# Patient Record
Sex: Male | Born: 1950 | Race: White | Hispanic: No | Marital: Single | Smoking: Current every day smoker
Health system: Southern US, Community
[De-identification: ages and names within clinical notes are randomized; demographics above are authoritative.]

## PROBLEM LIST (undated history)

## (undated) DIAGNOSIS — I509 Heart failure, unspecified: Secondary | ICD-10-CM

## (undated) DIAGNOSIS — I2510 Atherosclerotic heart disease of native coronary artery without angina pectoris: Secondary | ICD-10-CM

## (undated) DIAGNOSIS — J9819 Other pulmonary collapse: Secondary | ICD-10-CM

## (undated) DIAGNOSIS — I10 Essential (primary) hypertension: Secondary | ICD-10-CM

## (undated) HISTORY — PX: TONSILLECTOMY: SUR1361

---

## 2003-12-16 ENCOUNTER — Inpatient Hospital Stay (HOSPITAL_COMMUNITY): Admission: AD | Admit: 2003-12-16 | Discharge: 2003-12-19 | Payer: Self-pay | Admitting: Cardiology

## 2004-01-27 ENCOUNTER — Inpatient Hospital Stay (HOSPITAL_COMMUNITY): Admission: AD | Admit: 2004-01-27 | Discharge: 2004-01-27 | Payer: Self-pay | Admitting: Cardiology

## 2004-01-27 ENCOUNTER — Ambulatory Visit: Payer: Self-pay | Admitting: Cardiology

## 2004-01-28 ENCOUNTER — Inpatient Hospital Stay (HOSPITAL_COMMUNITY): Admission: EM | Admit: 2004-01-28 | Discharge: 2004-01-29 | Payer: Self-pay | Admitting: *Deleted

## 2004-01-29 ENCOUNTER — Ambulatory Visit: Payer: Self-pay | Admitting: Cardiology

## 2011-03-19 ENCOUNTER — Other Ambulatory Visit: Payer: Self-pay

## 2011-03-19 ENCOUNTER — Inpatient Hospital Stay (HOSPITAL_COMMUNITY)
Admission: EM | Admit: 2011-03-19 | Discharge: 2011-03-22 | DRG: 287 | Disposition: A | Discharge status: Home / Self Care | Payer: Medicaid Other | Attending: Family Medicine | Admitting: Family Medicine

## 2011-03-19 ENCOUNTER — Encounter (HOSPITAL_COMMUNITY): Payer: Self-pay | Admitting: Emergency Medicine

## 2011-03-19 ENCOUNTER — Emergency Department (HOSPITAL_COMMUNITY): Payer: Medicaid Other

## 2011-03-19 DIAGNOSIS — E785 Hyperlipidemia, unspecified: Secondary | ICD-10-CM | POA: Diagnosis present

## 2011-03-19 DIAGNOSIS — I252 Old myocardial infarction: Secondary | ICD-10-CM

## 2011-03-19 DIAGNOSIS — R079 Chest pain, unspecified: Principal | ICD-10-CM | POA: Diagnosis present

## 2011-03-19 DIAGNOSIS — J9819 Other pulmonary collapse: Secondary | ICD-10-CM | POA: Insufficient documentation

## 2011-03-19 DIAGNOSIS — R0602 Shortness of breath: Secondary | ICD-10-CM | POA: Diagnosis present

## 2011-03-19 DIAGNOSIS — F17200 Nicotine dependence, unspecified, uncomplicated: Secondary | ICD-10-CM | POA: Diagnosis present

## 2011-03-19 DIAGNOSIS — Z7982 Long term (current) use of aspirin: Secondary | ICD-10-CM

## 2011-03-19 DIAGNOSIS — J449 Chronic obstructive pulmonary disease, unspecified: Secondary | ICD-10-CM | POA: Diagnosis present

## 2011-03-19 DIAGNOSIS — I2510 Atherosclerotic heart disease of native coronary artery without angina pectoris: Secondary | ICD-10-CM | POA: Diagnosis present

## 2011-03-19 DIAGNOSIS — Z720 Tobacco use: Secondary | ICD-10-CM | POA: Diagnosis present

## 2011-03-19 DIAGNOSIS — J4489 Other specified chronic obstructive pulmonary disease: Secondary | ICD-10-CM | POA: Diagnosis present

## 2011-03-19 DIAGNOSIS — I509 Heart failure, unspecified: Secondary | ICD-10-CM | POA: Diagnosis present

## 2011-03-19 DIAGNOSIS — I10 Essential (primary) hypertension: Secondary | ICD-10-CM | POA: Diagnosis present

## 2011-03-19 HISTORY — DX: Essential (primary) hypertension: I10

## 2011-03-19 HISTORY — DX: Heart failure, unspecified: I50.9

## 2011-03-19 HISTORY — DX: Atherosclerotic heart disease of native coronary artery without angina pectoris: I25.10

## 2011-03-19 HISTORY — DX: Other pulmonary collapse: J98.19

## 2011-03-19 LAB — BASIC METABOLIC PANEL
BUN: 14 mg/dL (ref 6–23)
CO2: 23 mEq/L (ref 19–32)
Calcium: 9.6 mg/dL (ref 8.4–10.5)
Chloride: 107 mEq/L (ref 96–112)
Creatinine, Ser: 0.86 mg/dL (ref 0.50–1.35)
GFR calc Af Amer: >90 mL/min (ref >90)
GFR calc non Af Amer: >90 mL/min (ref >90)
Glucose, Bld: 91 mg/dL (ref 70–99)
Potassium: 4.4 mEq/L (ref 3.5–5.1)
Sodium: 140 mEq/L (ref 135–145)

## 2011-03-19 LAB — URINALYSIS, ROUTINE W REFLEX MICROSCOPIC
Bilirubin Urine: NEGATIVE
Glucose, UA: NEGATIVE mg/dL
Hgb urine dipstick: NEGATIVE
Ketones, ur: NEGATIVE mg/dL
Leukocytes, UA: NEGATIVE
Nitrite: NEGATIVE
Protein, ur: NEGATIVE mg/dL
Specific Gravity, Urine: 1.008 (ref 1.005–1.030)
Urobilinogen, UA: 0.2 mg/dL (ref 0.0–1.0)
pH: 5.5 (ref 5.0–8.0)

## 2011-03-19 LAB — CBC
HCT: 41 % (ref 39.0–52.0)
Hemoglobin: 14.5 g/dL (ref 13.0–17.0)
MCH: 33.1 pg (ref 26.0–34.0)
MCH: 32.4 pg (ref 26.0–34.0)
MCHC: 35.4 g/dL (ref 30.0–36.0)
MCV: 93.6 fL (ref 78.0–100.0)
MCV: 93.7 fL (ref 78.0–100.0)
Platelets: 196 10*3/uL (ref 150–400)
Platelets: 181 10*3/uL (ref 150–400)
RBC: 4.38 MIL/uL (ref 4.22–5.81)
RBC: 4.42 MIL/uL (ref 4.22–5.81)
RDW: 12.8 % (ref 11.5–15.5)
RDW: 12.8 % (ref 11.5–15.5)
WBC: 8 10*3/uL (ref 4.0–10.5)

## 2011-03-19 LAB — CARDIAC PANEL(CRET KIN+CKTOT+MB+TROPI)
CK, MB: 2 ng/mL (ref 0.3–4.0)
CK, MB: 2.1 ng/mL (ref 0.3–4.0)
Relative Index: INVALID (ref 0.0–2.5)
Relative Index: INVALID (ref 0.0–2.5)
Total CK: 67 U/L (ref 7–232)
Troponin I: <0.3 ng/mL (ref <0.30)
Troponin I: <0.3 ng/mL (ref <0.30)

## 2011-03-19 LAB — DIFFERENTIAL
Basophils Absolute: 0 10*3/uL (ref 0.0–0.1)
Basophils Relative: 0 % (ref 0–1)
Eosinophils Absolute: 0.2 10*3/uL (ref 0.0–0.7)
Eosinophils Relative: 3 % (ref 0–5)
Lymphocytes Relative: 30 % (ref 12–46)
Lymphs Abs: 2.4 10*3/uL (ref 0.7–4.0)
Monocytes Absolute: 0.6 10*3/uL (ref 0.1–1.0)
Monocytes Relative: 8 % (ref 3–12)
Neutro Abs: 4.8 10*3/uL (ref 1.7–7.7)
Neutrophils Relative %: 59 % (ref 43–77)

## 2011-03-19 MED ORDER — GUAIFENESIN-DM 100-10 MG/5ML PO SYRP: 5.0000 mL | ORAL_SOLUTION | ORAL | Status: DC | PRN

## 2011-03-19 MED ORDER — SODIUM CHLORIDE 0.9 % IJ SOLN
3.0000 mL | Freq: Two times a day (BID) | INTRAMUSCULAR | Status: DC
  Administered 2011-03-19 – 2011-03-21 (×4): 3 mL via INTRAVENOUS

## 2011-03-19 MED ORDER — HYDROCODONE-ACETAMINOPHEN 5-325 MG PO TABS
1.0000 | ORAL_TABLET | ORAL | Status: DC | PRN
  Administered 2011-03-19 – 2011-03-20 (×2): 2 via ORAL
  Filled 2011-03-19 (×2): qty 2

## 2011-03-19 MED ORDER — METOPROLOL TARTRATE 12.5 MG HALF TABLET
12.5000 mg | ORAL_TABLET | Freq: Two times a day (BID) | ORAL | Status: DC
  Administered 2011-03-19 – 2011-03-22 (×6): 12.5 mg via ORAL
  Filled 2011-03-19 (×8): qty 1

## 2011-03-19 MED ORDER — SIMVASTATIN 20 MG PO TABS
20.0000 mg | ORAL_TABLET | ORAL | Status: AC
  Administered 2011-03-19: 20 mg via ORAL
  Filled 2011-03-19: qty 1

## 2011-03-19 MED ORDER — ENOXAPARIN SODIUM 40 MG/0.4ML ~~LOC~~ SOLN
40.0000 mg | SUBCUTANEOUS | Status: DC
  Administered 2011-03-19: 40 mg via SUBCUTANEOUS
  Filled 2011-03-19 (×3): qty 0.4

## 2011-03-19 MED ORDER — SODIUM CHLORIDE 0.9 % IV SOLN: 250.0000 mL | INTRAVENOUS | Status: DC | PRN

## 2011-03-19 MED ORDER — ONDANSETRON HCL 4 MG PO TABS: 4.0000 mg | ORAL_TABLET | Freq: Four times a day (QID) | ORAL | Status: DC | PRN

## 2011-03-19 MED ORDER — NICOTINE 21 MG/24HR TD PT24
21.0000 mg | MEDICATED_PATCH | Freq: Every day | TRANSDERMAL | Status: DC
  Administered 2011-03-19 – 2011-03-22 (×4): 21 mg via TRANSDERMAL
  Filled 2011-03-19 (×4): qty 1

## 2011-03-19 MED ORDER — MORPHINE SULFATE 2 MG/ML IJ SOLN: 1.0000 mg | INTRAMUSCULAR | Status: DC | PRN

## 2011-03-19 MED ORDER — PANTOPRAZOLE SODIUM 40 MG PO TBEC
40.0000 mg | DELAYED_RELEASE_TABLET | Freq: Every day | ORAL | Status: DC
  Administered 2011-03-19 – 2011-03-22 (×4): 40 mg via ORAL
  Filled 2011-03-19 (×4): qty 1

## 2011-03-19 MED ORDER — NITROGLYCERIN 0.4 MG SL SUBL
0.4000 mg | SUBLINGUAL_TABLET | SUBLINGUAL | Status: DC | PRN
  Administered 2011-03-20 (×2): 0.4 mg via SUBLINGUAL
  Filled 2011-03-19: qty 25

## 2011-03-19 MED ORDER — ACETAMINOPHEN 325 MG PO TABS
650.0000 mg | ORAL_TABLET | Freq: Four times a day (QID) | ORAL | Status: DC | PRN
  Administered 2011-03-20: 650 mg via ORAL
  Filled 2011-03-19: qty 2

## 2011-03-19 MED ORDER — ALBUTEROL SULFATE (5 MG/ML) 0.5% IN NEBU: 2.5000 mg | INHALATION_SOLUTION | RESPIRATORY_TRACT | Status: DC | PRN

## 2011-03-19 MED ORDER — ASPIRIN EC 325 MG PO TBEC
325.0000 mg | DELAYED_RELEASE_TABLET | Freq: Every day | ORAL | Status: AC
  Administered 2011-03-19 – 2011-03-21 (×3): 325 mg via ORAL
  Filled 2011-03-19 (×3): qty 1

## 2011-03-19 MED ORDER — TIOTROPIUM BROMIDE MONOHYDRATE 18 MCG IN CAPS
18.0000 ug | ORAL_CAPSULE | Freq: Every day | RESPIRATORY_TRACT | Status: DC
  Administered 2011-03-20 – 2011-03-21 (×2): 18 ug via RESPIRATORY_TRACT
  Filled 2011-03-19: qty 5

## 2011-03-19 MED ORDER — ACETAMINOPHEN 650 MG RE SUPP: 650.0000 mg | Freq: Four times a day (QID) | RECTAL | Status: DC | PRN

## 2011-03-19 MED ORDER — SIMVASTATIN 20 MG PO TABS: 20.0000 mg | ORAL_TABLET | Freq: Every day | ORAL | Status: DC

## 2011-03-19 MED ORDER — SODIUM CHLORIDE 0.9 % IJ SOLN: 3.0000 mL | INTRAMUSCULAR | Status: DC | PRN

## 2011-03-19 MED ORDER — SIMVASTATIN 20 MG PO TABS
20.0000 mg | ORAL_TABLET | Freq: Every day | ORAL | Status: DC
  Administered 2011-03-20: 20 mg via ORAL
  Filled 2011-03-19 (×2): qty 1

## 2011-03-19 MED ORDER — ALUM & MAG HYDROXIDE-SIMETH 200-200-20 MG/5ML PO SUSP: 30.0000 mL | Freq: Four times a day (QID) | ORAL | Status: DC | PRN

## 2011-03-19 MED ORDER — ONDANSETRON HCL 4 MG/2ML IJ SOLN: 4.0000 mg | Freq: Four times a day (QID) | INTRAMUSCULAR | Status: DC | PRN

## 2011-03-19 NOTE — ED Notes (Signed)
Report called to [place], RN

## 2011-03-19 NOTE — ED Notes (Signed)
Report called to RN on yellow side of ED for transfer to room 20 in ED awaiting admission.

## 2011-03-19 NOTE — ED Notes (Signed)
Pt given meal tray.

## 2011-03-19 NOTE — ED Notes (Signed)
[id number] Ready 

## 2011-03-19 NOTE — Consult Note (Signed)
CARDIOLOGY CONSULT NOTE    Patient ID: Jesse Crosby MRN: [id number] DOB/AGE: 61/22/1952 61 y.o.  Admit date: 03/19/2011 Referring Physician Dr Jerral Ralph Primary Physician Karene Fry [facility] Primary Cardiologist N/A Reason for Consultation Chest pain.  HPI: Jesse Crosby is seen at the request of the hospitalist service for evaluation of chest pain. He has an extensive history of coronary disease. He reports that he has had 4 heart attacks in the past and has had 3 or 4 coronary stents placed. His first myocardial infarction was in 1995. He cannot recall when his last heart attack was. He has been treated at [facility], [facility], and at this facility. There is evidence that he had a cardiac catheterization here in 2005. He presented at that time with an acute inferior ST elevation myocardial infarction. He had stenting of the right coronary with a Taxus stent. This was complicated by asystole and cardiac arrest. He has been lost to cardiac followup. He states he is not able to afford his medications. He continues to smoke one pack per day. Over the past 6 months he has had intermittent symptoms of chest pain. These occur every 2-3 weeks. Today he developed severe substernal chest pain and left pectoral pain of sudden onset. It was rated at 8-9/10. He describes a sharp squeezing pain. It did not radiate. It was associated with shortness of breath but he had no diaphoresis or nausea vomiting. His pain initially lasted about 5 minutes. He went to see his regular medical doctor and his pain recurred there and he was referred to the emergency room. He is currently pain-free. He states this does feel like his heart attack pain.  Review of systems: He reports chronic back pain and arthritis pain in his back and legs. He has chronic shortness of breath and wheezing on exertion. He cannot even walk out to his mailbox without having to stop to rest. All other systems are reviewed and  are negative.  Past Medical History  Diagnosis Date  . CHF (congestive heart failure)   . Coronary artery disease   . Collapsed lung   . HTN (hypertension) 03/19/2011    Family history: He has a twin sister who has had coronary bypass surgery x2. His father died of a myocardial infarction and black lung disease. Mother died with a cerebral aneurysm. History   Social History  . Marital Status:  widowed     Spouse Name: N/A    Number of Children:  4  . Years of Education: N/A   Occupational History  .  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 He has been unemployed.    Social History Main Topics  . Smoking status: Current Everyday Smoker -- 1.0 packs/day for 45 years  . Smokeless tobacco: Not on file  . Alcohol Use: No  . Drug Use:   . Sexually Active:    Other Topics Concern  . Not on file   Social History Narrative  . No narrative on file    Past Surgical History  Procedure Date  . Tonsillectomy       (Not in a hospital admission)  Physical Exam: Blood pressure 127/81, pulse 84, temperature 97.8 F (36.6 C), temperature source Oral, resp. rate 20, height 5\' 10"  (1.778 m), weight 97.07 kg (214 lb), SpO2 98.00%. he is a well-developed white male in no acute distress. He is normocephalic, atraumatic. Pupils are equal round and reactive to light accommodation. Extraocular movements are  full. Oropharynx is clear. He does wear glasses. Neck is supple without JVD, adenopathy, thyromegaly, or bruits. Lungs reveal mild diffuse expiratory wheezes. Cardiac exam reveals a regular rate and rhythm without gallop, murmur, or click. Abdomen is soft, obese, nontender. He has no masses or bruits. Femoral and pedal pulses are 2+ and symmetric. Skin is warm and dry. He is alert oriented x3. Cranial nerves II through XII are intact. Labs:   Lab Results  Component Value Date   WBC 8.0 03/19/2011   HGB 14.5 03/19/2011   HCT 41.0 03/19/2011   MCV 93.6 03/19/2011    PLT 196 03/19/2011    Lab 03/19/11 1327  NA 140  K 4.4  CL 107  CO2 23  BUN 14  CREATININE 0.86  CALCIUM 9.6  PROT --  BILITOT --  ALKPHOS --  ALT --  AST --  GLUCOSE 91   Lab Results  Component Value Date   CKTOTAL 67 03/19/2011   CKMB 2.0 03/19/2011   TROPONINI <0.30 03/19/2011    No results found for this basename: CHOL   No results found for this basename: HDL   No results found for this basename: LDLCALC   No results found for this basename: TRIG   No results found for this basename: CHOLHDL   No results found for this basename: LDLDIRECT      Radiology: Basilar atelectasis  EKG: Normal sinus rhythm with a normal ECG.  ASSESSMENT AND PLAN:   1. Chest pain. Patient has an extensive history of coronary disease with multiple coronary stents. His symptoms are concerning for unstable angina.  2. Hyperlipidemia. Lipid levels are pending.  3. Tobacco abuse.  4. Wheezing with probable COPD.  5. Family history of coronary disease.  Plan: I agree with plans for admission with serial cardiac enzymes and ECG. Patient will be anticoagulated with Lovenox. He'll be initiated on beta blocker therapy, statin, and aspirin. He is counseled on smoking cessation. Patient is fairly high risk for significant coronary disease given his past history. I would tend to favor consideration for repeat cardiac catheterization but we will need to await his initial evaluation. An echocardiogram has been ordered. We will decide this weekend whether to proceed with coronary angiography versus consideration of stress testing.  SignedTheron Arista [facility] 03/19/2011, 5:59 PM

## 2011-03-19 NOTE — ED Provider Notes (Signed)
History     CSN: [id number]  Arrival date & time 03/19/11  1131   First MD Initiated Contact with Patient 03/19/11 1227      Chief Complaint  Patient presents with  . Chest Pain     Patient is a 61 y.o. male presenting with chest pain. The history is provided by the patient and a friend.  Chest Pain   3 year old white male with a history of CAD presents to the ED with chest pain that began this morning while at the doctor's office. He was sitting in a chair when the chest pain began. He describes the chest pain as a stabbing pressure that does not radiate anywhere. Associated symptoms include lower extremity edema, diaphoresis, and near-syncope. He has had several similar episodes in the past. Patient reports having 4 MIs in the past, each requiring a stent. The most recent one 3-4 years ago. He has NTG that he carries with him at all times. His chest pain will usually resolve in a few minutes with 1 NTG. This morning it took 3 NTG and 3 aspirin to resolve his chest pain. He had another occurrence of chest pain earlier this morning that lasted about 5 minutes before he left for the doctor. He denies nausea, vomiting, abdominal pain, shoulder pain, jaw pain, palpitations, and fever. He reports ocasional left arm numbness and paresthesias and has dyspnea on exertion at baseline. He currently smokes 1 ppd, down from 3 ppd. He reports history of alcohol abuse but denies current use. He has very poor follow-up and has not seen a doctor or taken any medication for 1.5 years. He was meeting this doctor this morning for the first time.   He also reports an approximate 20 lb weight gain in the past 6 months. His friends in the room report that he has been less active due to lack of work. He reports an increase in pant size from 38 to 42. He reports poor eating and a mostly sedentary lifestyle.    Past Medical History  Diagnosis Date  . CHF (congestive heart failure)   . Coronary artery disease   .  Collapsed lung     Past Surgical History  Procedure Date  . Tonsillectomy     No family history on file.  History  Substance Use Topics  . Smoking status: Current Everyday Smoker -- 1.0 packs/day for 45 years  . Smokeless tobacco: Not on file  . Alcohol Use: No      Review of Systems  Cardiovascular: Positive for chest pain.  All pertinent positives and negatives reviewed in the history of present illness  Allergies  Review of patient's allergies indicates no known allergies.  Home Medications   Current Outpatient Rx  Name Route Sig Dispense Refill  . ACETAMINOPHEN 500 MG PO TABS Oral Take 1,000 mg by mouth every 6 (six) hours as needed. For pain    . ASPIRIN EC 325 MG PO TBEC Oral Take 325 mg by mouth at bedtime.    Marland Kitchen NITROGLYCERIN 0.4 MG SL SUBL Sublingual Place 0.4 mg under the tongue every 5 (five) minutes as needed. For chest pain      BP 103/75  Pulse 80  Temp(Src) 98.1 F (36.7 C) (Oral)  Resp 14  Ht 5\' 10"  (1.778 m)  Wt 214 lb (97.07 kg)  BMI 30.71 kg/m2  SpO2 98%  Physical Exam  Constitutional: He is oriented to person, place, and time. He appears well-developed and well-nourished. No distress.  Overweight.   HENT:  Head: Normocephalic and atraumatic.  Neck: No JVD present. Carotid bruit is not present.  Cardiovascular: Normal rate, regular rhythm, normal heart sounds and intact distal pulses.  Exam reveals no gallop and no friction rub.   No murmur heard. Pulmonary/Chest: Effort normal. No respiratory distress. He has wheezes. He has no rales. He exhibits no tenderness.       Diffuse expiratory wheezing bilaterally.   Abdominal: Bowel sounds are normal. He exhibits distension. There is no tenderness.  Musculoskeletal: Normal range of motion. He exhibits no edema.  Neurological: He is alert and oriented to person, place, and time.  Skin: Skin is warm and dry. He is not diaphoretic.  Psychiatric: He has a normal mood and affect. His behavior is  normal.    ED Course  Procedures (including critical care time)    Date: 03/19/2011  Rate: 80  Rhythm: normal sinus rhythm  QRS Axis: normal  Intervals: normal  ST/T Wave abnormalities: normal  Conduction Disutrbances:none  Narrative Interpretation:   Old EKG Reviewed: none available         MDM  Patient to be admitted for his chest pain.   MDM Reviewed: nursing note and vitals Interpretation: ECG, x-ray and labs         Carlyle Dolly, PA-C 03/19/11 1536

## 2011-03-19 NOTE — H&P (Signed)
PATIENT DETAILS Name: Jesse Crosby Age: 61 y.o. Sex: male Date of Birth: 1950-08-02 Admit Date: 03/19/2011 PCP:No primary provider on file.   CHIEF COMPLAINT:  Chest pain.  HPI: Patient is a 61 year old white male, who has a history of coronary artery disease-acute MI x4-last PTCA per patient 4 years ago, ongoing tobacco abuse, non-compliance to medications secondary to financial issues-only has been taking aspirin and as needed nitroglycerin for the past 3 years, comes in with the above-noted complaints. Patient was at his friend's out this morning had sudden onset of left sided chest pain upon getting up and walking a short distance, they lasted for about 5 minutes, there was no associated nausea vomiting. There was some mild associated shortness of breath. Pain was described as sharp/pressure-like. There was no radiation. He then had a previously scheduled appointment with a local M.D.-as a result patient proceeded to the MDs office, while being evaluated patient had a recurrence of chest pain, this time it was persistent for round half an hour, it resolved in route to the hospital with the sublingual nitroglycerin tablets. He claims that the pain that he had today was similar to the prior chest pains he has had when he had his acute MIs. During my evaluation patient is currently chest pain-free. He also gives a history of exertional dyspnea, he claims that just walking up to his mailbox makes him short of breath. He has a 45 year history of smoking, claims he has cut down from 3 packs a day to one pack per day. It is not entirely clear whether he carries a diagnosis of COPD as well. He claims to have a cough almost all the time, however this has been much more worse recently.  ALLERGIES:  No Known Allergies  PAST MEDICAL HISTORY: Past Medical History  Diagnosis Date  . CHF (congestive heart failure)   . Coronary artery disease   . Collapsed lung   . HTN (hypertension) 03/19/2011     PAST SURGICAL HISTORY: Past Surgical History  Procedure Date  . Tonsillectomy     MEDICATIONS AT HOME: Prior to Admission medications   Medication Sig Start Date End Date Taking? Authorizing Provider  acetaminophen (TYLENOL) 500 MG tablet Take 1,000 mg by mouth every 6 (six) hours as needed. For pain   Yes Historical Provider, MD  aspirin EC 325 MG tablet Take 325 mg by mouth at bedtime.   Yes Historical Provider, MD  nitroGLYCERIN (NITROSTAT) 0.4 MG SL tablet Place 0.4 mg under the tongue every 5 (five) minutes as needed. For chest pain   Yes Historical Provider, MD    FAMILY HISTORY: No family history on file.  SOCIAL HISTORY:  reports that he has been smoking.  He does not have any smokeless tobacco history on file. He reports that he does not drink alcohol. His drug history not on file.  REVIEW OF SYSTEMS:  Constitutional:   No  weight loss, night sweats,  Fevers, chills, fatigue.  HEENT:    No headaches, Difficulty swallowing,Tooth/dental problems,Sore throat,  No sneezing, itching, ear ache, nasal congestion, post nasal drip,   Cardio-vascular:   Orthopnea, PND, swelling in lower extremities, anasarca,  dizziness, palpitations  GI:  No heartburn, indigestion, abdominal pain, nausea, vomiting, diarrhea, change in  bowel habits, loss of appetite  Resp:   No excess mucus, no productive cough,   No coughing up of blood.No change in color of mucus.No wheezing.No chest wall deformity  Skin:  no rash or lesions.  GU:  no dysuria, change in color of urine, no urgency or frequency.  No flank pain.  Musculoskeletal: No joint pain or swelling.  No decreased range of motion.  No back pain.  Psych: No change in mood or affect. No depression or anxiety.  No memory loss.   PHYSICAL EXAM: Blood pressure 123/78, pulse 76, temperature 97.8 F (36.6 C), temperature source Oral, resp. rate 18, height 5\' 10"  (1.778 m), weight 97.07 kg (214 lb), SpO2 97.00%.  General  appearance :Awake, alert, not in any distress. Speech Clear. Not toxic Looking HEENT: Atraumatic and Normocephalic, pupils equally reactive to light and accomodation Neck: supple, no JVD. No cervical lymphadenopathy.  Chest:Good air entry bilaterally with some scattered rhonchi CVS: S1 S2 regular, no murmurs.  Abdomen: Bowel sounds present, Non tender and not distended with no gaurding, rigidity or rebound. Extremities: B/L Lower Ext shows no edema, both legs are warm to touch, with  dorsalis pedis pulses palpable. Neurology: Awake alert, and oriented X 3, CN II-XII intact, Non focal, Deep Tendon Reflex-2+ all over, plantar's downgoing B/L, sensory exam is grossly intact.  Skin:No Rash Wounds:N/A  LABS ON ADMISSION:   Basename 03/19/11 1327  NA 140  K 4.4  CL 107  CO2 23  GLUCOSE 91  BUN 14  CREATININE 0.86  CALCIUM 9.6  MG --  PHOS --   No results found for this basename: AST:2,ALT:2,ALKPHOS:2,BILITOT:2,PROT:2,ALBUMIN:2 in the last 72 hours No results found for this basename: LIPASE:2,AMYLASE:2 in the last 72 hours  Basename 03/19/11 1327  WBC 8.0  NEUTROABS 4.8  HGB 14.5  HCT 41.0  MCV 93.6  PLT 196    Basename 03/19/11 1327  CKTOTAL 67  CKMB 2.0  CKMBINDEX --  TROPONINI <0.30   No results found for this basename: DDIMER:2 in the last 72 hours No components found with this basename: POCBNP:3   RADIOLOGIC STUDIES ON ADMISSION: Dg Chest 2 View  03/19/2011  *RADIOLOGY REPORT*  Clinical Data:  chest pain  CHEST - 2 VIEW  Comparison: 11/19/2008  Findings: Normal heart size and vascularity.  Streaky bibasilar densities compatible with atelectasis versus scarring.  No superimposed pneumonia, collapse, consolidation, effusion or pneumothorax.  Trachea midline.  IMPRESSION: Streaky basilar atelectasis.  Original Report Authenticated By: Judie Petit. Ruel Favors, M.D.    ASSESSMENT AND PLAN: Present on Admission:  .Chest pain -With both some typical and atypical features, given  his significant history of coronary artery disease we will admit him to telemetry unit and cycle cardiac enzymes. He will be started on aspirin, statin and beta blockers. We will consult cardiology as well.   Marland KitchenHTN (hypertension) -Will start him on Lopressor.   .Dyslipidemia -Will start him on statins, and check a lipid panel.   Marland KitchenCOPD (chronic obstructive pulmonary disease) -Lungs sound mostly clear with only scattered wheezing, we'll place him on as needed albuterol nebulizer, he shall be placed on Spiriva.   .Tobacco abuse -He has been counseled extensively by me,  however we will obtain tobacco cessation consult as well. We will place him on a nicotine patch while he is in the hospital.  Further plan will depend as patient's clinical course evolves and further radiologic and laboratory data become available. Patient will be monitored closely.  DVT Prophylaxis: Lovenox  Code Status: Full code  Total time spent for admission equals 45 minutes.  Jeoffrey Massed 03/19/2011, 4:23 PM

## 2011-03-19 NOTE — ED Notes (Signed)
Dinner tray ordered [facility]. 

## 2011-03-19 NOTE — ED Notes (Signed)
Per EMS pt began having CP at home at 0645 X 5 minutes, sharp in nature with SOB, slight diaphoresis, no n/v. Pt went to [facility] to see Dr. Rosary Lively for scheduled apppointment and had flu shot. Began having cp. Given NTG X2 sl , EMS called, given additional NTG X1sl with total relief of CP. Pt has distened, firm abd and 25 lb wt gain over last 6 months. Pt has hs of MIs , 4 stents, and is non compliant taking RX. Placed on [place] at 4l, EKG done. ST at rate 107 then NSR at rate 94.

## 2011-03-20 ENCOUNTER — Other Ambulatory Visit: Payer: Self-pay

## 2011-03-20 DIAGNOSIS — R072 Precordial pain: Secondary | ICD-10-CM

## 2011-03-20 DIAGNOSIS — R079 Chest pain, unspecified: Secondary | ICD-10-CM

## 2011-03-20 LAB — CARDIAC PANEL(CRET KIN+CKTOT+MB+TROPI)
CK, MB: 2 ng/mL (ref 0.3–4.0)
Relative Index: INVALID (ref 0.0–2.5)
Total CK: 57 U/L (ref 7–232)
Total CK: 59 U/L (ref 7–232)
Total CK: 65 U/L (ref 7–232)
Troponin I: <0.3 ng/mL (ref <0.30)
Troponin I: <0.3 ng/mL (ref <0.30)

## 2011-03-20 LAB — CBC
Hemoglobin: 13.7 g/dL (ref 13.0–17.0)
MCH: 31.7 pg (ref 26.0–34.0)
RBC: 4.32 MIL/uL (ref 4.22–5.81)

## 2011-03-20 LAB — BASIC METABOLIC PANEL
CO2: 23 mEq/L (ref 19–32)
Calcium: 9.1 mg/dL (ref 8.4–10.5)
Chloride: 107 mEq/L (ref 96–112)
Glucose, Bld: 99 mg/dL (ref 70–99)
Sodium: 140 mEq/L (ref 135–145)

## 2011-03-20 LAB — HEMOGLOBIN A1C: Hgb A1c MFr Bld: 5.6 % (ref <5.7)

## 2011-03-20 MED ORDER — NITROGLYCERIN 0.3 MG/HR TD PT24
0.3000 mg | MEDICATED_PATCH | Freq: Every day | TRANSDERMAL | Status: DC
  Administered 2011-03-20 – 2011-03-22 (×3): 0.3 mg via TRANSDERMAL
  Filled 2011-03-20 (×3): qty 1

## 2011-03-20 MED ORDER — LISINOPRIL 2.5 MG PO TABS
2.5000 mg | ORAL_TABLET | Freq: Every day | ORAL | Status: DC
  Administered 2011-03-20 – 2011-03-22 (×3): 2.5 mg via ORAL
  Filled 2011-03-20 (×3): qty 1

## 2011-03-20 MED ORDER — HEPARIN SOD (PORCINE) IN D5W 100 UNIT/ML IV SOLN
1400.0000 [IU]/h | INTRAVENOUS | Status: DC
  Administered 2011-03-20: 1250 [IU]/h via INTRAVENOUS
  Administered 2011-03-21: 1500 [IU]/h via INTRAVENOUS
  Administered 2011-03-22: 1400 [IU]/h via INTRAVENOUS
  Filled 2011-03-20 (×4): qty 250

## 2011-03-20 MED ORDER — HEPARIN BOLUS VIA INFUSION
4000.0000 [IU] | Freq: Once | INTRAVENOUS | Status: AC
  Administered 2011-03-20: 4000 [IU] via INTRAVENOUS
  Filled 2011-03-20: qty 4000

## 2011-03-20 MED ORDER — ASPIRIN 81 MG PO CHEW: 324.0000 mg | CHEWABLE_TABLET | ORAL | Status: DC

## 2011-03-20 MED ORDER — ASPIRIN 81 MG PO CHEW
324.0000 mg | CHEWABLE_TABLET | ORAL | Status: AC
  Administered 2011-03-22: 324 mg via ORAL
  Filled 2011-03-20: qty 4

## 2011-03-20 MED ORDER — SODIUM CHLORIDE 0.9 % IJ SOLN: 3.0000 mL | INTRAMUSCULAR | Status: DC | PRN

## 2011-03-20 MED ORDER — SODIUM CHLORIDE 0.9 % IJ SOLN: 3.0000 mL | Freq: Two times a day (BID) | INTRAMUSCULAR | Status: DC

## 2011-03-20 MED ORDER — SODIUM CHLORIDE 0.9 % IV SOLN
1.0000 mL/kg/h | INTRAVENOUS | Status: DC
  Administered 2011-03-22: 1 mL/kg/h via INTRAVENOUS

## 2011-03-20 MED ORDER — SODIUM CHLORIDE 0.9 % IV SOLN: 250.0000 mL | INTRAVENOUS | Status: DC | PRN

## 2011-03-20 MED ORDER — HEPARIN BOLUS VIA INFUSION
2000.0000 [IU] | Freq: Once | INTRAVENOUS | Status: AC
  Administered 2011-03-20: 2000 [IU] via INTRAVENOUS
  Filled 2011-03-20: qty 2000

## 2011-03-20 MED ORDER — ASPIRIN EC 325 MG PO TBEC: 325.0000 mg | DELAYED_RELEASE_TABLET | Freq: Every day | ORAL | Status: DC

## 2011-03-20 MED ORDER — ACETAMINOPHEN 325 MG PO TABS: 650.0000 mg | ORAL_TABLET | ORAL | Status: DC | PRN

## 2011-03-20 MED ORDER — ASPIRIN EC 81 MG PO TBEC: 81.0000 mg | DELAYED_RELEASE_TABLET | Freq: Every day | ORAL | Status: DC

## 2011-03-20 NOTE — Progress Notes (Signed)
  Echocardiogram 2D Echocardiogram has been performed.  Jesse Crosby, Real Cons 03/20/2011, 12:07 PM

## 2011-03-20 NOTE — Progress Notes (Signed)
ANTICOAGULATION CONSULT NOTE - Initial Consult  Pharmacy Consult for heparin Indication: chest pain/ACS  No Known Allergies  Patient Measurements: Height: 5\' 9"  (175.3 cm) Weight: 207 lb 10.8 oz (94.2 kg) (patient weghed on stand up scales) IBW/kg (Calculated) : 70.7    Vital Signs: Temp: 97 F (36.1 C) (01/12 1448) Temp src: Oral (01/12 1448) BP: 118/83 mmHg (01/12 1500) Pulse Rate: 81  (01/12 1500)  Labs:  Basename 03/20/11 0819 03/20/11 0615 03/19/11 2342 03/19/11 2152 03/19/11 2151 03/19/11 1327  HGB -- 13.7 -- -- 14.3 --  HCT -- 40.7 -- -- 41.4 41.0  PLT -- 180 -- -- 181 196  APTT -- -- -- -- -- --  LABPROT -- -- -- -- -- --  INR -- -- -- -- -- --  HEPARINUNFRC -- -- -- -- -- --  CREATININE -- 0.98 -- -- 0.98 0.86  CKTOTAL 57 -- 59 65 -- --  CKMB 1.8 -- 2.0 2.1 -- --  TROPONINI <0.30 -- <0.30 <0.30 -- --   Estimated Creatinine Clearance: 90.8 ml/min (by C-G formula based on Cr of 0.98).  Medical History: Past Medical History  Diagnosis Date  . CHF (congestive heart failure)   . Coronary artery disease   . Collapsed lung   . HTN (hypertension) 03/19/2011    Medications:  Prescriptions prior to admission  Medication Sig Dispense Refill  . acetaminophen (TYLENOL) 500 MG tablet Take 1,000 mg by mouth every 6 (six) hours as needed. For pain      . aspirin EC 325 MG tablet Take 325 mg by mouth at bedtime.      . nitroGLYCERIN (NITROSTAT) 0.4 MG SL tablet Place 0.4 mg under the tongue every 5 (five) minutes as needed. For chest pain        Assessment: 61 yo with a long h/o CAD who was admitted for CP. Worrisome for [place]. Awaiting for cath. IV heparin will be started for anticoagulation.   Goal of Therapy:  Heparin level 0.3-0.7 units/ml   Plan:  1. Heparin bolus 4000 units x1 2. Heparin drip at 1250 units/hr 3. Check 6 hr heparin level 4. Daily heparin level/cbc Ulyses [place] 03/20/2011,3:38 PM

## 2011-03-20 NOTE — Progress Notes (Signed)
Called by bedside RN --patient c/o chest pain 2/10.  Upon arrival to patients room pt alert and oriented, RN at bedside.  EKG being performed. Patient on 2 LPM nasal cannula, increased to 4 LPM. Pt c/o chest pain 2/10, denies radiating pain, nausea and vomiting.  Skin warm. Patient received 2 SL nitro by bedside RN.  Chest pain now 1/10.  MD at bedside. Labs to be drawn.   will continue to follow

## 2011-03-20 NOTE — Progress Notes (Signed)
[facility] progress note    Interval h/o:- 61yr old CM with 4 Coronary events in the past developed severe substernal chest pain and left pectoral pain of sudden onset. It was rated at 8-9/10. He describes a sharp squeezing pain. It did not radiate. It was associated with shortness of breath but he had no diaphoresis or nausea vomiting. His pain initially lasted about 5 minutes-sent to [facility] by PCP 03/19/11 for this  4 heart attacks in the past and has had 3 or 4 coronary stents placed. His first myocardial infarction was in 1995. He cannot recall when his last heart attack was. He has been treated at [facility], [facility], and at this facility. There is evidence that he had a cardiac catheterization here in 2005. He presented at that time with an acute inferior ST elevation myocardial infarction.  Subjective: Called by RN re: pt with cntral CP, non-rad, 2/10, no diaphor/no sob.  States he "had this sicne he has been here, but didn't;t wish to tell anyone" Nitro and protocls done Pt pain easing off.   Reports has had this issue chronically at home.  States when he climbs the driveway to get his mail has SOB and not able to do what he normally does Treatment Team:  Peter Swazil[place], MD Objective: Vital signs in last 24 hours: Temp:  [97 F (36.1 C)-98.4 F (36.9 C)] 97 F (36.1 C) (01/12 1448) Pulse Rate:  [62-93] 81  (01/12 1500) Resp:  [16-20] 18  (01/12 1500) BP: (106-135)/(68-98) 118/83 mmHg (01/12 1500) SpO2:  [96 %-99 %] 99 % (01/12 1448) Weight:  [94.1 kg (207 lb 7.3 oz)-94.2 kg (207 lb 10.8 oz)] 94.2 kg (207 lb 10.8 oz) (01/12 0500) Weight change:   Intake/Output Summary (Last 24 hours) at 03/20/11 1529 Last data filed at 03/20/11 1300  Gross per 24 hour  Intake    760 ml  Output   1800 ml  Net  -1040 ml    BP 118/83  Pulse 81  Temp(Src) 97 F (36.1 C) (Oral)  Resp 18  Ht 5\' 9"  (1.753 m)  Wt 94.2 kg (207 lb 10.8 oz)  BMI 30.67 kg/m2  SpO2  99% Head: Normocephalic, without obvious abnormality, atraumatic Throat: lips, mucosa, and tongue normal; teeth and gums normal Neck: no carotid bruit, no JVD, supple, symmetrical, trachea midline and thyroid not enlarged, symmetric, no tenderness/mass/nodules Chest wall: left sided chest wall tenderness, below nipple Heart: regular rate and rhythm, S1, S2 normal, no murmur, click, rub or gallop Abdomen: soft, non-tender; bowel sounds normal; no masses,  no organomegaly Pulses: 2+ and symmetric Neurologic: Grossly normal  Lab Results:  Basename 03/20/11 0615 03/19/11 2151 03/19/11 1327  NA 140 -- 140  K 3.9 -- 4.4  CL 107 -- 107  CO2 23 -- 23  GLUCOSE 99 -- 91  BUN 17 -- 14  CREATININE 0.98 0.98 --  CALCIUM 9.1 -- 9.6  MG -- -- --  PHOS -- -- --   No results found for this basename: AST:2,ALT:2,ALKPHOS:2,BILITOT:2,PROT:2,ALBUMIN:2 in the last 72 hours No results found for this basename: LIPASE:2,AMYLASE:2 in the last 72 hours  Basename 03/20/11 0615 03/19/11 2151 03/19/11 1327  WBC 6.1 7.2 --  NEUTROABS -- -- 4.8  HGB 13.7 14.3 --  HCT 40.7 41.4 --  MCV 94.2 93.7 --  PLT 180 181 --    Basename 03/20/11 0819 03/19/11 2342 03/19/11 2152  CKTOTAL 57 59 65  CKMB 1.8 2.0 2.1  CKMBINDEX -- -- --  TROPONINI <0.30 <0.30 <0.30   No components found with this basename: POCBNP:3 No results found for this basename: DDIMER:2 in the last 72 hours  Basename 03/19/11 2151  HGBA1C 5.6   No results found for this basename: CHOL:2,HDL:2,LDLCALC:2,TRIG:2,CHOLHDL:2,LDLDIRECT:2 in the last 72 hours No results found for this basename: TSH,T4TOTAL,FREET3,T3FREE,THYROIDAB in the last 72 hours No results found for this basename: VITAMINB12:2,FOLATE:2,FERRITIN:2,TIBC:2,IRON:2,RETICCTPCT:2 in the last 72 hours Micro Results:   Medications: I have reviewed the patient's current medications. Scheduled Meds:   . aspirin EC  325 mg Oral QHS  . enoxaparin  40 mg Subcutaneous Q24H  .  metoprolol tartrate  12.5 mg Oral BID  . nicotine  21 mg Transdermal Daily  . nitroGLYCERIN  0.3 mg Transdermal Daily  . pantoprazole  40 mg Oral Q1200  . simvastatin  20 mg Oral To Minor  . simvastatin  20 mg Oral q1800  . sodium chloride  3 mL Intravenous Q12H  . tiotropium  18 mcg Inhalation Daily  . DISCONTD: aspirin EC  81 mg Oral Daily  . DISCONTD: simvastatin  20 mg Oral q1800   Continuous Infusions:  PRN Meds:.sodium chloride, acetaminophen, acetaminophen, albuterol, alum & mag hydroxide-simeth, guaiFENesin-dextromethorphan, HYDROcodone-acetaminophen, morphine, nitroGLYCERIN, ondansetron (ZOFRAN) IV, ondansetron, sodium chloride, DISCONTD: acetaminophen  Assessment/Plan: Patient Active Problem List  Diagnoses  . Chest pain-suspicious for cardiogenic pain-ECho showed Apical hypokinesis -saw Dr Tenny Craw of Cards and mentioned case-recommended change to Heparin GTT for Chest pain and also Nitro paste Will likely have R/L heart cath Cycled Cardiac enzymes again EKG rpt in room looked within normal limits, no St-t wave changes  . HTN (hypertension)-continue Metoprolol 12.5 bid Add Ac lisinoril 2.5  . Dyslipidemia-get baseline lipid panel-risk factor modification.  Continue Simvastatin 20  . COPD (chronic obstructive pulmonary disease)-continue albuterol  . Tobacco abuse-ordered cessation counselling.  Strongly encouraged to quit     LOS: 1 day   [facility] 03/20/2011, 3:29 PM

## 2011-03-20 NOTE — Progress Notes (Signed)
ANTICOAGULATION CONSULT NOTE - Initial Consult  Pharmacy Consult for heparin Indication: chest pain/ACS  No Known Allergies  Patient Measurements: Height: 5\' 9"  (175.3 cm) Weight: 207 lb 10.8 oz (94.2 kg) (patient weghed on stand up scales) IBW/kg (Calculated) : 70.7    Vital Signs: Temp: 97.7 F (36.5 C) (01/12 2030) Temp src: Oral (01/12 2030) BP: 110/77 mmHg (01/12 2030) Pulse Rate: 81  (01/12 2030)  Labs:  Basename 03/20/11 2200 03/20/11 2155 03/20/11 1523 03/20/11 0819 03/20/11 0615 03/19/11 2151 03/19/11 1327  HGB -- -- -- -- 13.7 14.3 --  HCT -- -- -- -- 40.7 41.4 41.0  PLT -- -- -- -- 180 181 196  APTT -- -- -- -- -- -- --  LABPROT -- -- -- -- -- -- --  INR -- -- -- -- -- -- --  HEPARINUNFRC 0.24* -- -- -- -- -- --  CREATININE -- -- -- -- 0.98 0.98 0.86  CKTOTAL -- 68 65 57 -- -- --  CKMB -- 1.9 1.8 1.8 -- -- --  TROPONINI -- <0.30 <0.30 <0.30 -- -- --   Estimated Creatinine Clearance: 90.8 ml/min (by C-G formula based on Cr of 0.98).   Assessment: 61 yo male with chest pain for Heparin  Goal of Therapy:  Heparin level 0.3-0.7 units/ml   Plan:  Heparin 2000 units IV bolus, then increase heparin 1500 units/hr.  Follow-up am labs.   Teondre Jarosz, Gary Fleet 03/20/2011,11:45 PM

## 2011-03-20 NOTE — Progress Notes (Addendum)
1455 c/o chest pain.  Vital signs 135/98-82-4-18.  2/10 pain rate.  Rapid response notified.  Stat ekg. O2 vis nasal cannula at 2Lm.  1459 Rapid at bedside.  Nitro repeated sl.  1.5/10 chest pain rating.  1500 third Nitro was not given per Dr. Mahala Menghini..  Lab ordered.  Client is alert.  CP 1/10.  Rapid Response nurse at bedside alone with myself.  Vital signs returned to normal baseline. Head of bed elevated.  Denies radiating pain, no nausa and vomiting noted.  Skin warm and dry to touch.  Nitro paste 1/2 in. Per MD.  Dr. Mahala Menghini evaluated patient.

## 2011-03-20 NOTE — ED Provider Notes (Signed)
Medical screening examination/treatment/procedure(s) were conducted as a shared visit with non-physician practitioner(s) and myself.  I personally evaluated the patient during the encounter.  Patient has risk factors for CAD and good history. Admit  Donnetta Hutching, MD 03/20/11 0830

## 2011-03-20 NOTE — Consult Note (Signed)
Subjective: Chest pressure last night.  NOne now.  Breathing fair at rest. Objective: Filed Vitals:   03/19/11 2030 03/19/11 2224 03/19/11 2300 03/20/11 0500  BP: 123/79 123/79  106/68  Pulse: 87 87  75  Temp: 98.1 F (36.7 C)   98.4 F (36.9 C)  TempSrc: Oral   Oral  Resp: 17   19  Height:   5\' 9"  (1.753 m)   Weight:   207 lb 7.3 oz (94.1 kg) 207 lb 10.8 oz (94.2 kg)  SpO2: 97%   98%   Weight change:   Intake/Output Summary (Last 24 hours) at 03/20/11 0918 Last data filed at 03/20/11 0830  Gross per 24 hour  Intake    320 ml  Output    700 ml  Net   -380 ml    General: Alert, awake, oriented x3, in no acute distress Neck:  JVP is normal Heart: Regular rate and rhythm, without murmurs, rubs, gallops.  Lungs:Diffuse rhonchi.  Decreased airflwo. Exemities:  No edema.   Neuro: Grossly intact, nonfocal.   Lab Results: Results for orders placed during the hospital encounter of 03/19/11 (from the past 24 hour(s))  CBC     Status: Normal   Collection Time   03/19/11  1:27 PM      Component Value Range   WBC 8.0  4.0 - 10.5 (K/uL)   RBC 4.38  4.22 - 5.81 (MIL/uL)   Hemoglobin 14.5  13.0 - 17.0 (g/dL)   HCT 16.1  09.6 - 04.5 (%)   MCV 93.6  78.0 - 100.0 (fL)   MCH 33.1  26.0 - 34.0 (pg)   MCHC 35.4  30.0 - 36.0 (g/dL)   RDW 40.9  81.1 - 91.4 (%)   Platelets 196  150 - 400 (K/uL)  DIFFERENTIAL     Status: Normal   Collection Time   03/19/11  1:27 PM      Component Value Range   Neutrophils Relative 59  43 - 77 (%)   Neutro Abs 4.8  1.7 - 7.7 (K/uL)   Lymphocytes Relative 30  12 - 46 (%)   Lymphs Abs 2.4  0.7 - 4.0 (K/uL)   Monocytes Relative 8  3 - 12 (%)   Monocytes Absolute 0.6  0.1 - 1.0 (K/uL)   Eosinophils Relative 3  0 - 5 (%)   Eosinophils Absolute 0.2  0.0 - 0.7 (K/uL)   Basophils Relative 0  0 - 1 (%)   Basophils Absolute 0.0  0.0 - 0.1 (K/uL)  CARDIAC PANEL(CRET KIN+CKTOT+MB+TROPI)     Status: Normal   Collection Time   03/19/11  1:27 PM      Component  Value Range   Total CK 67  7 - 232 (U/L)   CK, MB 2.0  0.3 - 4.0 (ng/mL)   Troponin I <0.30  <0.30 (ng/mL)   Relative Index RELATIVE INDEX IS INVALID  0.0 - 2.5   BASIC METABOLIC PANEL     Status: Normal   Collection Time   03/19/11  1:27 PM      Component Value Range   Sodium 140  135 - 145 (mEq/L)   Potassium 4.4  3.5 - 5.1 (mEq/L)   Chloride 107  96 - 112 (mEq/L)   CO2 23  19 - 32 (mEq/L)   Glucose, Bld 91  70 - 99 (mg/dL)   BUN 14  6 - 23 (mg/dL)   Creatinine, Ser 7.82  0.50 - 1.35 (mg/dL)   Calcium 9.6  8.4 - 10.5 (mg/dL)   GFR calc non Af Amer >90  >90 (mL/min)   GFR calc Af Amer >90  >90 (mL/min)  URINALYSIS, ROUTINE W REFLEX MICROSCOPIC     Status: Normal   Collection Time   03/19/11  1:42 PM      Component Value Range   Color, Urine YELLOW  YELLOW    APPearance CLEAR  CLEAR    Specific Gravity, Urine 1.008  1.005 - 1.030    pH 5.5  5.0 - 8.0    Glucose, UA NEGATIVE  NEGATIVE (mg/dL)   Hgb urine dipstick NEGATIVE  NEGATIVE    Bilirubin Urine NEGATIVE  NEGATIVE    Ketones, ur NEGATIVE  NEGATIVE (mg/dL)   Protein, ur NEGATIVE  NEGATIVE (mg/dL)   Urobilinogen, UA 0.2  0.0 - 1.0 (mg/dL)   Nitrite NEGATIVE  NEGATIVE    Leukocytes, UA NEGATIVE  NEGATIVE   CBC     Status: Normal   Collection Time   03/19/11  9:51 PM      Component Value Range   WBC 7.2  4.0 - 10.5 (K/uL)   RBC 4.42  4.22 - 5.81 (MIL/uL)   Hemoglobin 14.3  13.0 - 17.0 (g/dL)   HCT 86.5  78.4 - 69.6 (%)   MCV 93.7  78.0 - 100.0 (fL)   MCH 32.4  26.0 - 34.0 (pg)   MCHC 34.5  30.0 - 36.0 (g/dL)   RDW 29.5  28.4 - 13.2 (%)   Platelets 181  150 - 400 (K/uL)  CREATININE, SERUM     Status: Abnormal   Collection Time   03/19/11  9:51 PM      Component Value Range   Creatinine, Ser 0.98  0.50 - 1.35 (mg/dL)   GFR calc non Af Amer 88 (*) >90 (mL/min)   GFR calc Af Amer >90  >90 (mL/min)  HEMOGLOBIN A1C     Status: Normal   Collection Time   03/19/11  9:51 PM      Component Value Range   Hemoglobin A1C 5.6   <5.7 (%)   Mean Plasma Glucose 114  <117 (mg/dL)  CARDIAC PANEL(CRET KIN+CKTOT+MB+TROPI)     Status: Normal   Collection Time   03/19/11  9:52 PM      Component Value Range   Total CK 65  7 - 232 (U/L)   CK, MB 2.1  0.3 - 4.0 (ng/mL)   Troponin I <0.30  <0.30 (ng/mL)   Relative Index RELATIVE INDEX IS INVALID  0.0 - 2.5   CARDIAC PANEL(CRET KIN+CKTOT+MB+TROPI)     Status: Normal   Collection Time   03/19/11 11:42 PM      Component Value Range   Total CK 59  7 - 232 (U/L)   CK, MB 2.0  0.3 - 4.0 (ng/mL)   Troponin I <0.30  <0.30 (ng/mL)   Relative Index RELATIVE INDEX IS INVALID  0.0 - 2.5   CBC     Status: Normal   Collection Time   03/20/11  6:15 AM      Component Value Range   WBC 6.1  4.0 - 10.5 (K/uL)   RBC 4.32  4.22 - 5.81 (MIL/uL)   Hemoglobin 13.7  13.0 - 17.0 (g/dL)   HCT 44.0  10.2 - 72.5 (%)   MCV 94.2  78.0 - 100.0 (fL)   MCH 31.7  26.0 - 34.0 (pg)   MCHC 33.7  30.0 - 36.0 (g/dL)   RDW 36.6  44.0 - 34.7 (%)  Platelets 180  150 - 400 (K/uL)  BASIC METABOLIC PANEL     Status: Abnormal   Collection Time   03/20/11  6:15 AM      Component Value Range   Sodium 140  135 - 145 (mEq/L)   Potassium 3.9  3.5 - 5.1 (mEq/L)   Chloride 107  96 - 112 (mEq/L)   CO2 23  19 - 32 (mEq/L)   Glucose, Bld 99  70 - 99 (mg/dL)   BUN 17  6 - 23 (mg/dL)   Creatinine, Ser 2.95  0.50 - 1.35 (mg/dL)   Calcium 9.1  8.4 - 28.4 (mg/dL)   GFR calc non Af Amer 88 (*) >90 (mL/min)   GFR calc Af Amer >90  >90 (mL/min)  CARDIAC PANEL(CRET KIN+CKTOT+MB+TROPI)     Status: Normal (Preliminary result)   Collection Time   03/20/11  8:19 AM      Component Value Range   Total CK PENDING  7 - 232 (U/L)   CK, MB 1.8  0.3 - 4.0 (ng/mL)   Troponin I <0.30  <0.30 (ng/mL)   Relative Index PENDING  0.0 - 2.5     Studies/Results: Dg Chest 2 View  03/19/2011  *RADIOLOGY REPORT*  Clinical Data:  chest pain  CHEST - 2 VIEW  Comparison: 11/19/2008  Findings: Normal heart size and vascularity.  Streaky  bibasilar densities compatible with atelectasis versus scarring.  No superimposed pneumonia, collapse, consolidation, effusion or pneumothorax.  Trachea midline.  IMPRESSION: Streaky basilar atelectasis.  Original Report Authenticated By: Judie Petit. Ruel Favors, M.D.    Medications: I have reviewed the patient's current medications.   Patient Active Hospital Problem List: Chest pain (03/19/2011)   Assessment: R/O fro MI.  I am concerned given history about ischemia and CAD.  I would recomm R and L heart cath to define anatomy and pressure  Plan for mon.  Continue meds   Plan:  HTN (hypertension) (03/19/2011)   Assessment:Adequate   Plan:  Dyslipidemia (03/19/2011)   Assessment:  Zocor   Plan: COPD (chronic obstructive pulmonary disease) (03/19/2011)   Assessment: Signif.     Plan: Tobacco abuse (03/19/2011)   Assessment: Patient says he has quit smoking.  Encouraged him to continue   Plan:    LOS: 1 day   Dietrich Pates 03/20/2011, 9:18 AM

## 2011-03-21 LAB — CARDIAC PANEL(CRET KIN+CKTOT+MB+TROPI)
CK, MB: 1.7 ng/mL (ref 0.3–4.0)
CK, MB: 1.7 ng/mL (ref 0.3–4.0)
Relative Index: INVALID (ref 0.0–2.5)
Relative Index: INVALID (ref 0.0–2.5)
Relative Index: INVALID (ref 0.0–2.5)
Total CK: 67 U/L (ref 7–232)
Troponin I: <0.3 ng/mL (ref <0.30)
Troponin I: <0.3 ng/mL (ref <0.30)

## 2011-03-21 LAB — LIPID PANEL
Cholesterol: 200 mg/dL (ref 0–200)
Total CHOL/HDL Ratio: 5.3 RATIO
VLDL: 27 mg/dL (ref 0–40)

## 2011-03-21 LAB — CBC
HCT: 40 % (ref 39.0–52.0)
Hemoglobin: 14 g/dL (ref 13.0–17.0)
MCHC: 35 g/dL (ref 30.0–36.0)

## 2011-03-21 LAB — PROTIME-INR: INR: 1.04 (ref 0.00–1.49)

## 2011-03-21 MED ORDER — ROSUVASTATIN CALCIUM 20 MG PO TABS
20.0000 mg | ORAL_TABLET | Freq: Every day | ORAL | Status: DC
  Administered 2011-03-21 (×2): 20 mg via ORAL
  Filled 2011-03-21 (×2): qty 1

## 2011-03-21 NOTE — Progress Notes (Signed)
ANTICOAGULATION CONSULT NOTE - Initial Consult  Pharmacy Consult for heparin Indication: chest pain/ACS  Assessment: 61 yo male admitted with chest pain, now on IV heparin in anticipation of R/L heart cath on Monday AM.  Heparin level slightly above therapeutic goal (HL=0.74). No bleeding issues reported. Will decrease infusion rate by ~1unit/kg/hr.  Goal of Therapy:  Heparin level 0.3-0.7 units/ml   Plan:  Decrease IV heparin infusion rate to 1400 units/hr (=14 mL/hr) Check heparin level in AM. Follow-up plans post-cath.  Jesse Crosby. Saul Fordyce, PharmD  03/21/2011 11:02 AM   No Known Allergies  Patient Measurements: Height: 5\' 9"  (175.3 cm) Weight: 207 lb 14.3 oz (94.3 kg) IBW/kg (Calculated) : 70.7  Heparin Dosing Weight: 90.2kg  Vital Signs: Temp: 98.1 F (36.7 C) (01/13 0433) Temp src: Oral (01/13 0433) BP: 114/62 mmHg (01/13 0947) Pulse Rate: 68  (01/13 0947)  Labs:  Basename 03/21/11 0910 03/21/11 0244 03/20/11 2200 03/20/11 2155 03/20/11 0615 03/19/11 2151 03/19/11 1327  HGB 14.0 -- -- -- 13.7 -- --  HCT 40.0 -- -- -- 40.7 41.4 --  PLT 183 -- -- -- 180 181 --  APTT -- -- -- -- -- -- --  LABPROT 13.8 -- -- -- -- -- --  INR 1.04 -- -- -- -- -- --  HEPARINUNFRC 0.74* -- 0.24* -- -- -- --  CREATININE -- -- -- -- 0.98 0.98 0.86  CKTOTAL 61 65 -- 68 -- -- --  CKMB 1.7 1.7 -- 1.9 -- -- --  TROPONINI <0.30 <0.30 -- <0.30 -- -- --   Estimated Creatinine Clearance: 90.8 ml/min (by C-G formula based on Cr of 0.98).

## 2011-03-21 NOTE — Consult Note (Signed)
Subjective: No CP today.  Feels good. Objective: Filed Vitals:   03/21/11 0200 03/21/11 0433 03/21/11 0813 03/21/11 0947  BP: 94/65 96/65  114/62  Pulse: 71 75  68  Temp: 98.1 F (36.7 C) 98.1 F (36.7 C)    TempSrc: Oral Oral    Resp: 17 18    Height:      Weight:  207 lb 14.3 oz (94.3 kg)    SpO2: 93% 96% 95%    Weight change: -6 lb 1.7 oz (-2.77 kg)  Intake/Output Summary (Last 24 hours) at 03/21/11 0950 Last data filed at 03/21/11 0850  Gross per 24 hour  Intake 1690.58 ml  Output   3325 ml  Net -1634.42 ml    General: Alert, awake, oriented x3, in no acute distress Neck:  JVP is normal Heart: Regular rate and rhythm, without murmurs, rubs, gallops.  Lungs: Upper airway wheeze when moves around in bed, Exemities:  No edema.   Neuro: Grossly intact, nonfocal.   Lab Results: Results for orders placed during the hospital encounter of 03/19/11 (from the past 24 hour(s))  CARDIAC PANEL(CRET KIN+CKTOT+MB+TROPI)     Status: Normal   Collection Time   03/20/11  3:23 PM      Component Value Range   Total CK 65  7 - 232 (U/L)   CK, MB 1.8  0.3 - 4.0 (ng/mL)   Troponin I <0.30  <0.30 (ng/mL)   Relative Index RELATIVE INDEX IS INVALID  0.0 - 2.5   CARDIAC PANEL(CRET KIN+CKTOT+MB+TROPI)     Status: Normal   Collection Time   03/20/11  9:55 PM      Component Value Range   Total CK 68  7 - 232 (U/L)   CK, MB 1.9  0.3 - 4.0 (ng/mL)   Troponin I <0.30  <0.30 (ng/mL)   Relative Index RELATIVE INDEX IS INVALID  0.0 - 2.5   HEPARIN LEVEL (UNFRACTIONATED)     Status: Abnormal   Collection Time   03/20/11 10:00 PM      Component Value Range   Heparin Unfractionated 0.24 (*) 0.30 - 0.70 (IU/mL)  CARDIAC PANEL(CRET KIN+CKTOT+MB+TROPI)     Status: Normal   Collection Time   03/21/11  2:44 AM      Component Value Range   Total CK 65  7 - 232 (U/L)   CK, MB 1.7  0.3 - 4.0 (ng/mL)   Troponin I <0.30  <0.30 (ng/mL)   Relative Index RELATIVE INDEX IS INVALID  0.0 - 2.5   CBC      Status: Normal   Collection Time   03/21/11  9:10 AM      Component Value Range   WBC 7.6  4.0 - 10.5 (K/uL)   RBC 4.28  4.22 - 5.81 (MIL/uL)   Hemoglobin 14.0  13.0 - 17.0 (g/dL)   HCT 11.9  14.7 - 82.9 (%)   MCV 93.5  78.0 - 100.0 (fL)   MCH 32.7  26.0 - 34.0 (pg)   MCHC 35.0  30.0 - 36.0 (g/dL)   RDW 56.2  13.0 - 86.5 (%)   Platelets 183  150 - 400 (K/uL)    Studies/Results: Dg Chest 2 View  03/19/2011  *RADIOLOGY REPORT*  Clinical Data:  chest pain  CHEST - 2 VIEW  Comparison: 11/19/2008  Findings: Normal heart size and vascularity.  Streaky bibasilar densities compatible with atelectasis versus scarring.  No superimposed pneumonia, collapse, consolidation, effusion or pneumothorax.  Trachea midline.  IMPRESSION: Streaky basilar atelectasis.  Original Report Authenticated By: Judie Petit. Ruel Favors, M.D.    Medications: I have reviewed the patient's current medications.   Patient Active Hospital Problem List: Chest pain (03/19/2011)   Assessment: Plan for R and L heart cath in AM.  COntinue heparin and NTG   Plan:  HTN (hypertension) (03/19/2011)   Assessment: Continue meds   Plan:  Dyslipidemia (03/19/2011)   Assessment:   KEep on statin   Plan:  COPD (chronic obstructive pulmonary disease) (03/19/2011)   Assessment: Significant.     Plan:  Tobacco abuse (03/19/2011)   Assessment: Claims he won't smoke again.   Plan:   LOS: 2 days   Dietrich Pates 03/21/2011, 9:50 AM

## 2011-03-21 NOTE — Progress Notes (Signed)
[facility] progress note    Interval h/o:- 61yr old CM with 4 Coronary events in the past developed severe substernal chest pain and left pectoral pain of sudden onset. It was rated at 8-9/10. He describes a sharp squeezing pain. It did not radiate. It was associated with shortness of breath but he had no diaphoresis or nausea vomiting. His pain initially lasted about 5 minutes-sent to [facility] by PCP 03/19/11 for this  4 heart attacks in the past and has had 3 or 4 coronary stents placed. His first myocardial infarction was in 1995. He cannot recall when his last heart attack was. He has been treated at [facility], [facility], and at this facility. There is evidence that he had a cardiac catheterization here in 2005. He presented at that time with an acute inferior ST elevation myocardial infarction.  Subjective: No subjective cp/sob/n.  Sitting in bed comfortably watching TV.  scheduled for morning Cardiac cath.  Tolerate dPO diet.  NO stool, but not unusualy for him to go 2-3 days without having any. Treatment Team:  Peter [place], MD Objective: Vital signs in last 24 hours: Temp:  [97.7 F (36.5 C)-98.1 F (36.7 C)] 97.7 F (36.5 C) (01/13 1355) Pulse Rate:  [68-81] 80  (01/13 1355) Resp:  [17-19] 19  (01/13 1355) BP: (94-118)/(62-83) 109/68 mmHg (01/13 1355) SpO2:  [92 %-98 %] 92 % (01/13 1355) Weight:  [94.3 kg (207 lb 14.3 oz)] 94.3 kg (207 lb 14.3 oz) (01/13 0433) Weight change: -2.77 kg (-6 lb 1.7 oz)  Intake/Output Summary (Last 24 hours) at 03/21/11 1449 Last data filed at 03/21/11 1300  Gross per 24 hour  Intake 1680.08 ml  Output   2825 ml  Net -1144.92 ml    BP 109/68  Pulse 80  Temp(Src) 97.7 F (36.5 C) (Oral)  Resp 19  Ht 5\' 9"  (1.753 m)  Wt 94.3 kg (207 lb 14.3 oz)  BMI 30.70 kg/m2  SpO2 92% Head: Normocephalic, without obvious abnormality, atraumatic Throat: lips, mucosa, and tongue normal; teeth and gums normal Neck: no carotid  bruit, no JVD, supple, symmetrical, trachea midline and thyroid not enlarged, symmetric, no tenderness/mass/nodules Chest wall: left sided chest wall tenderness, below nipple Heart: regular rate and rhythm, S1, S2 normal, no murmur, click, rub or gallop Abdomen: soft, non-tender; bowel sounds normal; no masses,  no organomegaly Pulses: 2+ and symmetric Neurologic: Grossly normal Tele nsr, no red alarms Lab Results:  Basename 03/20/11 0615 03/19/11 2151 03/19/11 1327  NA 140 -- 140  K 3.9 -- 4.4  CL 107 -- 107  CO2 23 -- 23  GLUCOSE 99 -- 91  BUN 17 -- 14  CREATININE 0.98 0.98 --  CALCIUM 9.1 -- 9.6  MG -- -- --  PHOS -- -- --   No results found for this basename: AST:2,ALT:2,ALKPHOS:2,BILITOT:2,PROT:2,ALBUMIN:2 in the last 72 hours No results found for this basename: LIPASE:2,AMYLASE:2 in the last 72 hours  Basename 03/21/11 0910 03/20/11 0615 03/19/11 1327  WBC 7.6 6.1 --  NEUTROABS -- -- 4.8  HGB 14.0 13.7 --  HCT 40.0 40.7 --  MCV 93.5 94.2 --  PLT 183 180 --    Basename 03/21/11 0910 03/21/11 0244 03/20/11 2155  CKTOTAL 61 65 68  CKMB 1.7 1.7 1.9  CKMBINDEX -- -- --  TROPONINI <0.30 <0.30 <0.30   No components found with this basename: POCBNP:3 No results found for this basename: DDIMER:2 in the last 72 hours  Basename 03/19/11 2151  HGBA1C 5.6  Basename 03/21/11 0852  CHOL 200  HDL 38*  LDLCALC 135*  TRIG 135  CHOLHDL 5.3  LDLDIRECT --   No results found for this basename: TSH,T4TOTAL,FREET3,T3FREE,THYROIDAB in the last 72 hours No results found for this basename: VITAMINB12:2,FOLATE:2,FERRITIN:2,TIBC:2,IRON:2,RETICCTPCT:2 in the last 72 hours Micro Results:   Medications: I have reviewed the patient's current medications. Scheduled Meds:    . aspirin  324 mg Oral Pre-Cath  . aspirin EC  325 mg Oral QHS  . aspirin EC  325 mg Oral Daily  . heparin  2,000 Units Intravenous Once  . heparin  4,000 Units Intravenous Once  . lisinopril  2.5 mg Oral  Daily  . metoprolol tartrate  12.5 mg Oral BID  . nicotine  21 mg Transdermal Daily  . nitroGLYCERIN  0.3 mg Transdermal Daily  . pantoprazole  40 mg Oral Q1200  . simvastatin  20 mg Oral q1800  . sodium chloride  3 mL Intravenous Q12H  . sodium chloride  3 mL Intravenous Q12H  . tiotropium  18 mcg Inhalation Daily  . DISCONTD: aspirin  324 mg Oral Pre-Cath  . DISCONTD: aspirin EC  81 mg Oral Daily  . DISCONTD: enoxaparin  40 mg Subcutaneous Q24H   Continuous Infusions:    . sodium chloride    . heparin 1,400 Units/hr (03/21/11 1100)   PRN Meds:.sodium chloride, sodium chloride, acetaminophen, acetaminophen, albuterol, alum & mag hydroxide-simeth, guaiFENesin-dextromethorphan, HYDROcodone-acetaminophen, morphine, nitroGLYCERIN, ondansetron (ZOFRAN) IV, ondansetron, sodium chloride, sodium chloride, DISCONTD: acetaminophen  Assessment/Plan: Patient Active Problem List  Diagnoses  . Chest pain-suspicious for cardiogenic pain-ECho showed Apical hypokinesis, EF 50-60% Continue Heparin GTT for Chest pain and also Nitro paste  R/L heart cath scheduled in am Cycled Cardiac enzymes again Dispo dependant on findings after Cath  . HTN (hypertension)-continue Metoprolol 12.5 bid Add Ace- lisinoril 2.5  . Dyslipidemia-baseline lipid panel=LDL 135-risk factor modification.  Change Simvastatin 20-->Crestor 20  . COPD (chronic obstructive pulmonary disease)-continue albuterol  . Tobacco abuse-ordered cessation counselling.  Strongly encouraged to quit   . CHF-Ef 55-60% with apical Hypokin + Grade 1 d  dysfunx-Continue Metoprolol/Lisinopril      LOS: 2 days   Geniya Fulgham,JAI 03/21/2011, 2:49 PM

## 2011-03-22 ENCOUNTER — Encounter (HOSPITAL_COMMUNITY): Payer: Self-pay | Admitting: Internal Medicine

## 2011-03-22 ENCOUNTER — Encounter (HOSPITAL_COMMUNITY)
Admission: EM | Disposition: A | Discharge status: Home / Self Care | Payer: Self-pay | Source: Home / Self Care | Attending: Family Medicine

## 2011-03-22 DIAGNOSIS — I2510 Atherosclerotic heart disease of native coronary artery without angina pectoris: Secondary | ICD-10-CM

## 2011-03-22 HISTORY — PX: LEFT AND RIGHT HEART CATHETERIZATION WITH CORONARY ANGIOGRAM: SHX5449

## 2011-03-22 LAB — POCT I-STAT 3, ART BLOOD GAS (G3+)
Acid-base deficit: 2 mmol/L (ref 0.0–2.0)
Bicarbonate: 22.2 mEq/L (ref 20.0–24.0)
pCO2 arterial: 37 mmHg (ref 35.0–45.0)
pO2, Arterial: 93 mmHg (ref 80.0–100.0)

## 2011-03-22 LAB — POCT ACTIVATED CLOTTING TIME: Activated Clotting Time: 144 seconds

## 2011-03-22 LAB — POCT I-STAT 3, VENOUS BLOOD GAS (G3P V)
Acid-base deficit: 3 mmol/L — ABNORMAL HIGH (ref 0.0–2.0)
Acid-base deficit: 4 mmol/L — ABNORMAL HIGH (ref 0.0–2.0)
Bicarbonate: 22.3 mEq/L (ref 20.0–24.0)
Bicarbonate: 22.9 mEq/L (ref 20.0–24.0)
O2 Saturation: 64 %
O2 Saturation: 64 %
TCO2: 24 mmol/L (ref 0–100)
TCO2: 24 mmol/L (ref 0–100)
pCO2, Ven: 43.4 mmHg — ABNORMAL LOW (ref 45.0–50.0)
pO2, Ven: 36 mmHg (ref 30.0–45.0)

## 2011-03-22 LAB — CBC
HCT: 39.8 % (ref 39.0–52.0)
Hemoglobin: 13.9 g/dL (ref 13.0–17.0)
MCH: 32.7 pg (ref 26.0–34.0)
MCHC: 34.9 g/dL (ref 30.0–36.0)
MCV: 93.6 fL (ref 78.0–100.0)

## 2011-03-22 LAB — CARDIAC PANEL(CRET KIN+CKTOT+MB+TROPI): Relative Index: INVALID (ref 0.0–2.5)

## 2011-03-22 SURGERY — LEFT AND RIGHT HEART CATHETERIZATION WITH CORONARY ANGIOGRAM
Anesthesia: LOCAL

## 2011-03-22 MED ORDER — LISINOPRIL 2.5 MG PO TABS: 2.5000 mg | ORAL_TABLET | Freq: Every day | ORAL | Status: AC

## 2011-03-22 MED ORDER — HEPARIN (PORCINE) IN NACL 2-0.9 UNIT/ML-% IJ SOLN
INTRAMUSCULAR | Status: AC
  Filled 2011-03-22: qty 2000

## 2011-03-22 MED ORDER — MIDAZOLAM HCL 2 MG/2ML IJ SOLN
INTRAMUSCULAR | Status: AC
  Filled 2011-03-22: qty 2

## 2011-03-22 MED ORDER — METOPROLOL TARTRATE 12.5 MG HALF TABLET: 12.5000 mg | ORAL_TABLET | Freq: Two times a day (BID) | ORAL | Status: DC

## 2011-03-22 MED ORDER — ACETAMINOPHEN 325 MG PO TABS: 650.0000 mg | ORAL_TABLET | ORAL | Status: DC | PRN

## 2011-03-22 MED ORDER — LISINOPRIL 2.5 MG PO TABS: 2.5000 mg | ORAL_TABLET | Freq: Every day | ORAL | Status: DC

## 2011-03-22 MED ORDER — LIDOCAINE HCL (PF) 1 % IJ SOLN
INTRAMUSCULAR | Status: AC
  Filled 2011-03-22: qty 30

## 2011-03-22 MED ORDER — ONDANSETRON HCL 4 MG/2ML IJ SOLN: 4.0000 mg | Freq: Four times a day (QID) | INTRAMUSCULAR | Status: DC | PRN

## 2011-03-22 MED ORDER — NITROGLYCERIN 0.4 MG SL SUBL: 0.4000 mg | SUBLINGUAL_TABLET | SUBLINGUAL | Status: DC | PRN

## 2011-03-22 MED ORDER — SODIUM CHLORIDE 0.9 % IV SOLN: INTRAVENOUS | Status: AC

## 2011-03-22 MED ORDER — NICOTINE 21 MG/24HR TD PT24: 21.0000 | MEDICATED_PATCH | Freq: Every day | TRANSDERMAL | Status: AC

## 2011-03-22 MED ORDER — FENTANYL CITRATE 0.05 MG/ML IJ SOLN
INTRAMUSCULAR | Status: AC
  Filled 2011-03-22: qty 2

## 2011-03-22 MED ORDER — NITROGLYCERIN 0.2 MG/ML ON CALL CATH LAB
INTRAVENOUS | Status: AC
  Filled 2011-03-22: qty 1

## 2011-03-22 MED ORDER — ISOSORBIDE MONONITRATE ER 60 MG PO TB24: 30.0000 mg | ORAL_TABLET | Freq: Every day | ORAL | Status: AC

## 2011-03-22 MED ORDER — NICOTINE 21 MG/24HR TD PT24: 21.0000 | MEDICATED_PATCH | Freq: Every day | TRANSDERMAL | Status: DC

## 2011-03-22 NOTE — Progress Notes (Signed)
Utilization review complete 

## 2011-03-22 NOTE — Progress Notes (Signed)
Jesse Patty, MD Physician Signed Cardiology Progress Notes 03/22/2011 2:42 PM  Advanced Heart Failure   Subjective:   [name] is an 53 male with CAD and chronic diastolic HF EF 60-65%, PAF, PAD, DM2 and chronic back pain with hx of left hip arthroplasty a few months ago. Well known to [place] from HF clinic. Asked by family to see while in hospital.    Admitted with cellulitis and MSSA bacteremia due to vertebral diskitis. Was volumi overloaded on admit. Lasix increased 40 mg BID 1/10 and weight has gone down 6 pounds since admit. Weights show 4 lb weight gain overnight but note sure this is accurate. Baseline weight in clinic 134 pounds. (this am 144)   Feels better. Denies SOB/PND/Orthopnea. BM today.      Intake/Output Summary (Last 24 hours) at 03/22/11 1442 Last data filed at 03/22/11 1403   Gross per 24 hour   Intake    1093 ml   Output     926 ml   Net     167 ml      Current meds:    .  allopurinol   100 mg  Oral  Daily   .  aspirin EC   81 mg  Oral  Daily   .  ceFAZolin (ANCEF) IV   2 g  Intravenous  Q12H   .  citalopram   20 mg  Oral  Daily   .  darifenacin   7.5 mg  Oral  Daily   .  feeding supplement   30 mL  Oral  BID   .  fentaNYL   25 mcg  Transdermal  Q72H   .  ferrous sulfate   325 mg  Oral  Q breakfast   .  furosemide   40 mg  Oral  BID   .  insulin aspart   0-15 Units  Subcutaneous  TID WC   .  insulin aspart   0-5 Units  Subcutaneous  QHS   .  isosorbide mononitrate   30 mg  Oral  Daily   .  lidocaine   1 patch  Transdermal  Q24H   .  mulitivitamin with minerals   1 tablet  Oral  Daily   .  pantoprazole   40 mg  Oral  Q1200   .  polyethylene glycol   17 g  Oral  Daily   .  potassium chloride SA   20 mEq  Oral  Daily   .  senna-docusate   2 tablet  Oral  BID   .  sodium chloride   3 mL  Intravenous  Q12H   .  Vitamin D (Ergocalciferol)   50,000 Units  Oral  Q Wed    Infusions:     Objective:  Blood pressure 137/82, pulse 89, temperature  97.6 F (36.4 C), temperature source Oral, resp. rate 20, height 5\' 2"  (1.575 m), weight 65.7 kg (144 lb 13.5 oz), SpO2 98.00%. Weight change: 1.9 kg (4 lb 3 oz)   Physical Exam: General:  Chronically ill  appearing. No resp difficulty HEENT: normal Neck: supple. JVP to jaw . Carotids 2+ bilat; no bruits. No lymphadenopathy or thryomegaly appreciated. Cor: PMI nondisplaced. Regular rate & rhythm. No rubs, gallops or murmurs. Lungs: clear Abdomen: soft, nontender, mildly distended. No hepatosplenomegaly. No bruits or masses. Good bowel sounds. Extremities: no cyanosis, clubbing, rash, trace edema Neuro: alert & orientedx3, cranial nerves grossly intact. moves all 4 extremities w/o difficulty. Affect pleasant  Lab Results: Basic Metabolic Panel: Lab  03/22/11 5784  03/21/11 0532  03/20/11 0500  03/19/11 0535  03/18/11 0515   NA  135  136  135  136  133*   K  3.7  3.8  --  --  --   CL  96  96  93*  95*  93*   CO2  29  33*  35*  36*  36*   GLUCOSE  135*  119*  124*  108*  126*   BUN  29*  33*  39*  48*  51*   CREATININE  0.80  0.78  0.83  0.90  0.93   CALCIUM  9.3  9.3  9.5  9.3  9.6   MG  --  --  --  --  --   PHOS  --  --  --  --  --    Liver Function Tests: No results found for this basename: AST:5,ALT:5,ALKPHOS:5,BILITOT:5,PROT:5,ALBUMIN:5 in the last 168 hours No results found for this basename: LIPASE:5,AMYLASE:5 in the last 168 hours No results found for this basename: AMMONIA:5 in the last 168 hours CBC: Lab  03/19/11 0535  03/18/11 0515   WBC  5.5  4.9   NEUTROABS  --  --   HGB  8.3*  8.1*   HCT  27.1*  27.2*   MCV  93.8  95.4   PLT  209  213    Cardiac Enzymes: No results found for this basename: CKTOTAL:5,CKMB:5,CKMBINDEX:5,TROPONINI:5 in the last 168 hours BNP: No components found with this basename: POCBNP:5 CBG: Lab  03/22/11 1129  03/22/11 0622  03/21/11 2110  03/21/11 1619  03/21/11 0601   GLUCAP  172*  140*  116*  120*  129*    Microbiology: Lab  Results   Component  Value  Date     CULT  NO GROWTH  03/07/2011     CULT   Value: STAPHYLOCOCCUS AUREUS Note: SUSCEPTIBILITIES PERFORMED ON PREVIOUS CULTURE WITHIN THE LAST 5 DAYS. Note: Gram Stain Report Called to,Read Back By and Verified With: Lars Mage RN on 03/08/11 at 05:40 by Christie Nottingham  03/07/2011     CULT   Value: STAPHYLOCOCCUS AUREUS Note: RIFAMPIN AND GENTAMICIN SHOULD NOT BE USED AS SINGLE DRUGS FOR TREATMENT OF STAPH INFECTIONS. This organism DOES NOT demonstrate inducible Clindamycin resistance in vitro. Note: Gram Stain Report Called to,Read Back By and Verified With: Lars Mage RN on 03/08/11 at 05:40 by Christie Nottingham  03/07/2011     CULT  KLEBSIELLA PNEUMONIAE  10/26/2010     CULT  ESCHERICHIA COLI  10/01/2008    No results found for this basename: CULT:2,SDES:2 in the last 168 hours   Imaging: No results found.     ASSESSMENT:  1. Acute on chronic diastolic HF with mild volume overload   2. CAD   3. PAF   4. Cellulitis   5. MSSA bacteremia   6. Acute delirium, resolving   7. Severe discitis     PLAN/DISCUSSION: Volumes status improved from admit but still with fluid on board. Continue Lasix 40 mg bid. Can consider one doe of oral metolazone 2.5 mg to facilitate diuresis.   Will need follow up in HF clinic with 7 days of discharge.    LOS: 15 days      Arvilla Meres, MD 03/22/2011, 2:42 PM

## 2011-03-22 NOTE — Op Note (Signed)
Cardiac Cath Procedure Note  Indication: Chest pain and dyspnea   Procedures performed:  1) Right heart cathererization 2) Selective coronary angiography 3) Left heart catheterization 4) Left ventriculogram  Description of procedure:     The risks and indication of the procedure were explained. Consent was signed and placed on the chart. An appropriate timeout was taken prior to the procedure. The right groin was prepped and draped in the routine sterile fashion and anesthetized with 1% local lidocaine.   A 5 FR arterial sheath was placed in the right femoral artery using a modified Seldinger technique. Standard catheters including a JL4, JR4 and angled pigtail were used. All catheter exchanges were made over a wire. A 7 FR venous sheath was placed in the right femoral vein using a modified Seldinger technique. A standard Swan-Ganz catheter was used for the procedure.   Complications:  None apparent  Findings:  RA =  9 RV =  28/2/9 PA =  27/4 (16) PCW = 10 Fick cardiac output/index = 4.5/2.1 PVR = 1.3 Woods FA sat = 97% PA sat = 64%, 64% Ao Pressure: 100/68 (82) LV Pressure: 92/1/9  There was no signficant gradient across the aortic valve on pullback.  Left main: Calcified. Distal 30-40% stenosis  LAD: 30% prox calcified. 40% mid  LCX: Small ramus, small OM-1, large OM-2. In Lcx 40% prox, 30% mid  RCA: Large dominant vessel. 30% prox in stent. 40-50% mid. Scattered plaque distally  LV-gram done in the RAO projection: Ejection fraction = 55%. Inferoapical HK  Assessment: 1. Nonobstructive CAD 2. Normal right heart pressures 3. EF 55% with infero-apical Hk  Plan/Discussion:  Medical therapy. Smoking cessation reinforced. Can d/c home today after bedrest, if access site stable.  Arvilla Meres, MD 8:07 AM

## 2011-03-22 NOTE — Progress Notes (Signed)
03/22/11 Nursing 1505 DC IV, DC Tele, DC Home. Discharge instructions and home medications discussed with patient and patient's family. Patient denies any questions or concerns at this time. Patient leaving unit via wheelchair and appears in no acute distress. Ernesta Amble, RN

## 2011-03-22 NOTE — Discharge Summary (Signed)
Physician Discharge Summary  Patient ID: Jesse Crosby MRN: [id number] DOB/AGE: 1950/04/22 61 y.o.  Admit date: 03/19/2011 Discharge date: 03/22/2011  Admission Diagnoses: Chest Pain  Discharge Diagnoses:  Principal Problem:  *Chest pain Active Problems:  HTN (hypertension)  Dyslipidemia  COPD (chronic obstructive pulmonary disease)  Tobacco abuse  Discharged Condition: good  Hospital Course:Patient is a 61 year old white male, h/o coronary artery disease-acute MI x4-last PTCA per patient 4 years ago, ongoing tobacco abuse, non-compliance to medications secondary to financial issues-only has been taking aspirin and as needed nitroglycerin for the past 3 years. Presented with shortness of breath central chest pain sharp pressure 03/19/11-like given nitroglycerin with resolution.  Noted increasing shortness of breath with regular ambulation up to mailbox at his house.   Patient complaint of increasing chest pain thereafter discharged although he stated that it was only mild. Patient was placed on nitro paste and also on IV heparin as this was transitioned from lovenox.  Cardiac enzymes were all negative.  EKG at this time was normal. He had a lipid panel showing his LDL is 135 he showed 38 total cholesterol 200 Patient underwent right and left-sided heart cath 03/21/2010 which showed 1 nonobstructive CAD 2. Normal right heart pressures 3. EF 55% with inferior-apical hypokinesis Cardiology lobe hour saw the patient during hospitalization and after cath and recommended medical management smoking cessation was reinforced. Patient to followup with primary care physician in one week and cardiologist in 2-4 weeks  Consults: cardiology  Significant Diagnostic Studies: labs: SHowed LDL 138 and Cardiac Cath Findings:  RA =  9 RV =  28/2/9 PA =  27/4 (16) PCW = 10 Fick cardiac output/index = 4.5/2.1 PVR = 1.3 Woods FA sat = 97% PA sat = 64%, 64% Ao Pressure: 100/68 (82) LV  Pressure: 92/1/9  There was no signficant gradient across the aortic valve on pullback.  Left main: Calcified. Distal 30-40% stenosis  LAD: 30% prox calcified. 40% mid  LCX: Small ramus, small OM-1, large OM-2. In Lcx 40% prox, 30% mid  RCA: Large dominant vessel. 30% prox in stent. 40-50% mid. Scattered plaque distally  LV-gram done in the RAO projection: Ejection fraction = 55%. Inferoapical HK  Assessment: 1. Nonobstructive CAD 2. Normal right heart pressures 3. EF 55% with infero-apical Hk  Treatments: cardiac meds: lisinopril (Zestril), metoprolol and Imdur + ASA 325  Discharge Exam: Blood pressure 130/76, pulse 77, temperature 97.4 F (36.3 C), temperature source Oral, resp. rate 18, height 5\' 9"  (1.753 m), weight 93.2 kg (205 lb 7.5 oz), SpO2 93.00%. Alert oriented, NAD, no jvd,no thyromegally  ctab, s1 s2 no m/r/g, no bruit Abd soft nt/nd neruo I   Disposition:   Discharge Orders    Future Orders Please Complete By Expires   Diet - low sodium heart healthy      Increase activity slowly      Call MD for:  extreme fatigue      Call MD for:  persistant nausea and vomiting      Call MD for:  severe uncontrolled pain      Call MD for:  redness, tenderness, or signs of infection (pain, swelling, redness, odor or green/yellow discharge around incision site)      Call MD for:  hives        Medication List  As of 03/22/2011 12:13 PM   TAKE these medications         acetaminophen 500 MG tablet   Commonly known as: TYLENOL   Take  1,000 mg by mouth every 6 (six) hours as needed. For pain      aspirin EC 325 MG tablet   Take 325 mg by mouth at bedtime.      isosorbide mononitrate 60 MG 24 hr tablet   Commonly known as: IMDUR   Take 0.5 tablets (30 mg total) by mouth daily.      lisinopril 2.5 MG tablet   Commonly known as: PRINIVIL,ZESTRIL   Take 1 tablet (2.5 mg total) by mouth daily.      metoprolol tartrate 12.5 mg Tabs   Commonly known as: LOPRESSOR   Take  0.5 tablets (12.5 mg total) by mouth 2 (two) times daily.      nicotine 21 mg/24hr patch   Commonly known as: NICODERM CQ - dosed in mg/24 hours   Place 21 patches onto the skin daily.      nitroGLYCERIN 0.4 MG SL tablet   Commonly known as: NITROSTAT   Place 1 tablet (0.4 mg total) under the tongue every 5 (five) minutes as needed for chest pain. For chest pain           Follow-up Information    Follow up with Arvilla Meres, MD. Schedule an appointment as soon as possible for a visit in 1 month.   Contact information:   [address] [place] [address] [phone]          Signed: Pleas Koch 03/22/2011, 12:13 PM

## 2011-03-22 NOTE — H&P (View-Only) (Signed)
[facility] progress note    Interval h/o:- 60yr old CM with 4 Coronary events in the past developed severe substernal chest pain and left pectoral pain of sudden onset. It was rated at 8-9/10. He describes a sharp squeezing pain. It did not radiate. It was associated with shortness of breath but he had no diaphoresis or nausea vomiting. His pain initially lasted about 5 minutes-sent to [facility] by PCP 03/19/11 for this  4 heart attacks in the past and has had 3 or 4 coronary stents placed. His first myocardial infarction was in 1995. He cannot recall when his last heart attack was. He has been treated at [facility], [facility], and at this facility. There is evidence that he had a cardiac catheterization here in 2005. He presented at that time with an acute inferior ST elevation myocardial infarction.  Subjective: No subjective cp/sob/n.  Sitting in bed comfortably watching TV.  scheduled for morning Cardiac cath.  Tolerate dPO diet.  NO stool, but not unusualy for him to go 2-3 days without having any. Treatment Team:  Peter Jordan, MD Objective: Vital signs in last 24 hours: Temp:  [97.7 F (36.5 C)-98.1 F (36.7 C)] 97.7 F (36.5 C) (01/13 1355) Pulse Rate:  [68-81] 80  (01/13 1355) Resp:  [17-19] 19  (01/13 1355) BP: (94-118)/(62-83) 109/68 mmHg (01/13 1355) SpO2:  [92 %-98 %] 92 % (01/13 1355) Weight:  [94.3 kg (207 lb 14.3 oz)] 94.3 kg (207 lb 14.3 oz) (01/13 0433) Weight change: -2.77 kg (-6 lb 1.7 oz)  Intake/Output Summary (Last 24 hours) at 03/21/11 1449 Last data filed at 03/21/11 1300  Gross per 24 hour  Intake 1680.08 ml  Output   2825 ml  Net -1144.92 ml    BP 109/68  Pulse 80  Temp(Src) 97.7 F (36.5 C) (Oral)  Resp 19  Ht 5' 9" (1.753 m)  Wt 94.3 kg (207 lb 14.3 oz)  BMI 30.70 kg/m2  SpO2 92% Head: Normocephalic, without obvious abnormality, atraumatic Throat: lips, mucosa, and tongue normal; teeth and gums normal Neck: no carotid  bruit, no JVD, supple, symmetrical, trachea midline and thyroid not enlarged, symmetric, no tenderness/mass/nodules Chest wall: left sided chest wall tenderness, below nipple Heart: regular rate and rhythm, S1, S2 normal, no murmur, click, rub or gallop Abdomen: soft, non-tender; bowel sounds normal; no masses,  no organomegaly Pulses: 2+ and symmetric Neurologic: Grossly normal Tele nsr, no red alarms Lab Results:  Basename 03/20/11 0615 03/19/11 2151 03/19/11 1327  NA 140 -- 140  K 3.9 -- 4.4  CL 107 -- 107  CO2 23 -- 23  GLUCOSE 99 -- 91  BUN 17 -- 14  CREATININE 0.98 0.98 --  CALCIUM 9.1 -- 9.6  MG -- -- --  PHOS -- -- --   No results found for this basename: AST:2,ALT:2,ALKPHOS:2,BILITOT:2,PROT:2,ALBUMIN:2 in the last 72 hours No results found for this basename: LIPASE:2,AMYLASE:2 in the last 72 hours  Basename 03/21/11 0910 03/20/11 0615 03/19/11 1327  WBC 7.6 6.1 --  NEUTROABS -- -- 4.8  HGB 14.0 13.7 --  HCT 40.0 40.7 --  MCV 93.5 94.2 --  PLT 183 180 --    Basename 03/21/11 0910 03/21/11 0244 03/20/11 2155  CKTOTAL 61 65 68  CKMB 1.7 1.7 1.9  CKMBINDEX -- -- --  TROPONINI <0.30 <0.30 <0.30   No components found with this basename: POCBNP:3 No results found for this basename: DDIMER:2 in the last 72 hours  Basename 03/19/11 2151  HGBA1C 5.6      Basename 03/21/11 0852  CHOL 200  HDL 38*  LDLCALC 135*  TRIG 135  CHOLHDL 5.3  LDLDIRECT --   No results found for this basename: TSH,T4TOTAL,FREET3,T3FREE,THYROIDAB in the last 72 hours No results found for this basename: VITAMINB12:2,FOLATE:2,FERRITIN:2,TIBC:2,IRON:2,RETICCTPCT:2 in the last 72 hours Micro Results:   Medications: I have reviewed the patient's current medications. Scheduled Meds:    . aspirin  324 mg Oral Pre-Cath  . aspirin EC  325 mg Oral QHS  . aspirin EC  325 mg Oral Daily  . heparin  2,000 Units Intravenous Once  . heparin  4,000 Units Intravenous Once  . lisinopril  2.5 mg Oral  Daily  . metoprolol tartrate  12.5 mg Oral BID  . nicotine  21 mg Transdermal Daily  . nitroGLYCERIN  0.3 mg Transdermal Daily  . pantoprazole  40 mg Oral Q1200  . simvastatin  20 mg Oral q1800  . sodium chloride  3 mL Intravenous Q12H  . sodium chloride  3 mL Intravenous Q12H  . tiotropium  18 mcg Inhalation Daily  . DISCONTD: aspirin  324 mg Oral Pre-Cath  . DISCONTD: aspirin EC  81 mg Oral Daily  . DISCONTD: enoxaparin  40 mg Subcutaneous Q24H   Continuous Infusions:    . sodium chloride    . heparin 1,400 Units/hr (03/21/11 1100)   PRN Meds:.sodium chloride, sodium chloride, acetaminophen, acetaminophen, albuterol, alum & mag hydroxide-simeth, guaiFENesin-dextromethorphan, HYDROcodone-acetaminophen, morphine, nitroGLYCERIN, ondansetron (ZOFRAN) IV, ondansetron, sodium chloride, sodium chloride, DISCONTD: acetaminophen  Assessment/Plan: Patient Active Problem List  Diagnoses  . Chest pain-suspicious for cardiogenic pain-ECho showed Apical hypokinesis, EF 50-60% Continue Heparin GTT for Chest pain and also Nitro paste  R/L heart cath scheduled in am Cycled Cardiac enzymes again Dispo dependant on findings after Cath  . HTN (hypertension)-continue Metoprolol 12.5 bid Add Ace- lisinoril 2.5  . Dyslipidemia-baseline lipid panel=LDL 135-risk factor modification.  Change Simvastatin 20-->Crestor 20  . COPD (chronic obstructive pulmonary disease)-continue albuterol  . Tobacco abuse-ordered cessation counselling.  Strongly encouraged to quit   . CHF-Ef 55-60% with apical Hypokin + Grade 1 d  dysfunx-Continue Metoprolol/Lisinopril      LOS: 2 days   Inetta Dicke,JAI 03/21/2011, 2:49 PM   

## 2011-03-22 NOTE — Interval H&P Note (Signed)
History and Physical Interval Note:  03/22/2011 7:39 AM  Jesse Crosby  has presented today for surgery, with the diagnosis of chest pain and dyspnea. The various methods of treatment have been discussed with the patient and family. After consideration of risks, benefits and other options for treatment, the patient has consented to  Procedure(s): LEFT AND RIGHT HEART CATHETERIZATION WITH CORONARY ANGIOGRAM as a surgical intervention .  The patients' history has been reviewed, patient examined, no change in status, stable for surgery.  I have reviewed the patients' chart and labs.  Questions were answered to the patient's satisfaction.     Daniel Bensimhon

## 2011-03-25 ENCOUNTER — Telehealth (HOSPITAL_COMMUNITY): Payer: Self-pay | Admitting: *Deleted

## 2011-03-25 NOTE — Telephone Encounter (Signed)
Ms Short called today regarding Jesse Crosby.  He was recently d/c'd from the hosptial and was told to follow up with [place] per Dr Terrall Laity.  I was not sure if he should be scheduled here or at [facility].  Please review and let the patient know where he should go.  Thanks!

## 2011-03-25 NOTE — Telephone Encounter (Signed)
Can you please explain to them that we are [facility] and they just need a regular cardiologist, they can call Henrietta at [phone], Dr Tenny Craw is the cardiologist who consulted on him in the hospital they can see her, thanks

## 2011-04-12 ENCOUNTER — Ambulatory Visit: Payer: 59 | Admitting: Internal Medicine

## 2011-04-22 ENCOUNTER — Encounter: Payer: Self-pay | Admitting: Internal Medicine

## 2011-11-04 DIAGNOSIS — G4730 Sleep apnea, unspecified: Secondary | ICD-10-CM | POA: Insufficient documentation

## 2012-07-12 DIAGNOSIS — E785 Hyperlipidemia, unspecified: Secondary | ICD-10-CM | POA: Insufficient documentation

## 2012-07-12 DIAGNOSIS — I2510 Atherosclerotic heart disease of native coronary artery without angina pectoris: Secondary | ICD-10-CM | POA: Insufficient documentation

## 2012-07-12 DIAGNOSIS — I252 Old myocardial infarction: Secondary | ICD-10-CM | POA: Insufficient documentation

## 2012-09-13 IMAGING — CR DG CHEST 2V
2 series · 2 of 2 positions shown · non-contrast
Comparison: 11/19/2008

CLINICAL DATA: chest pain

CHEST - 2 VIEW

[w chest pa]
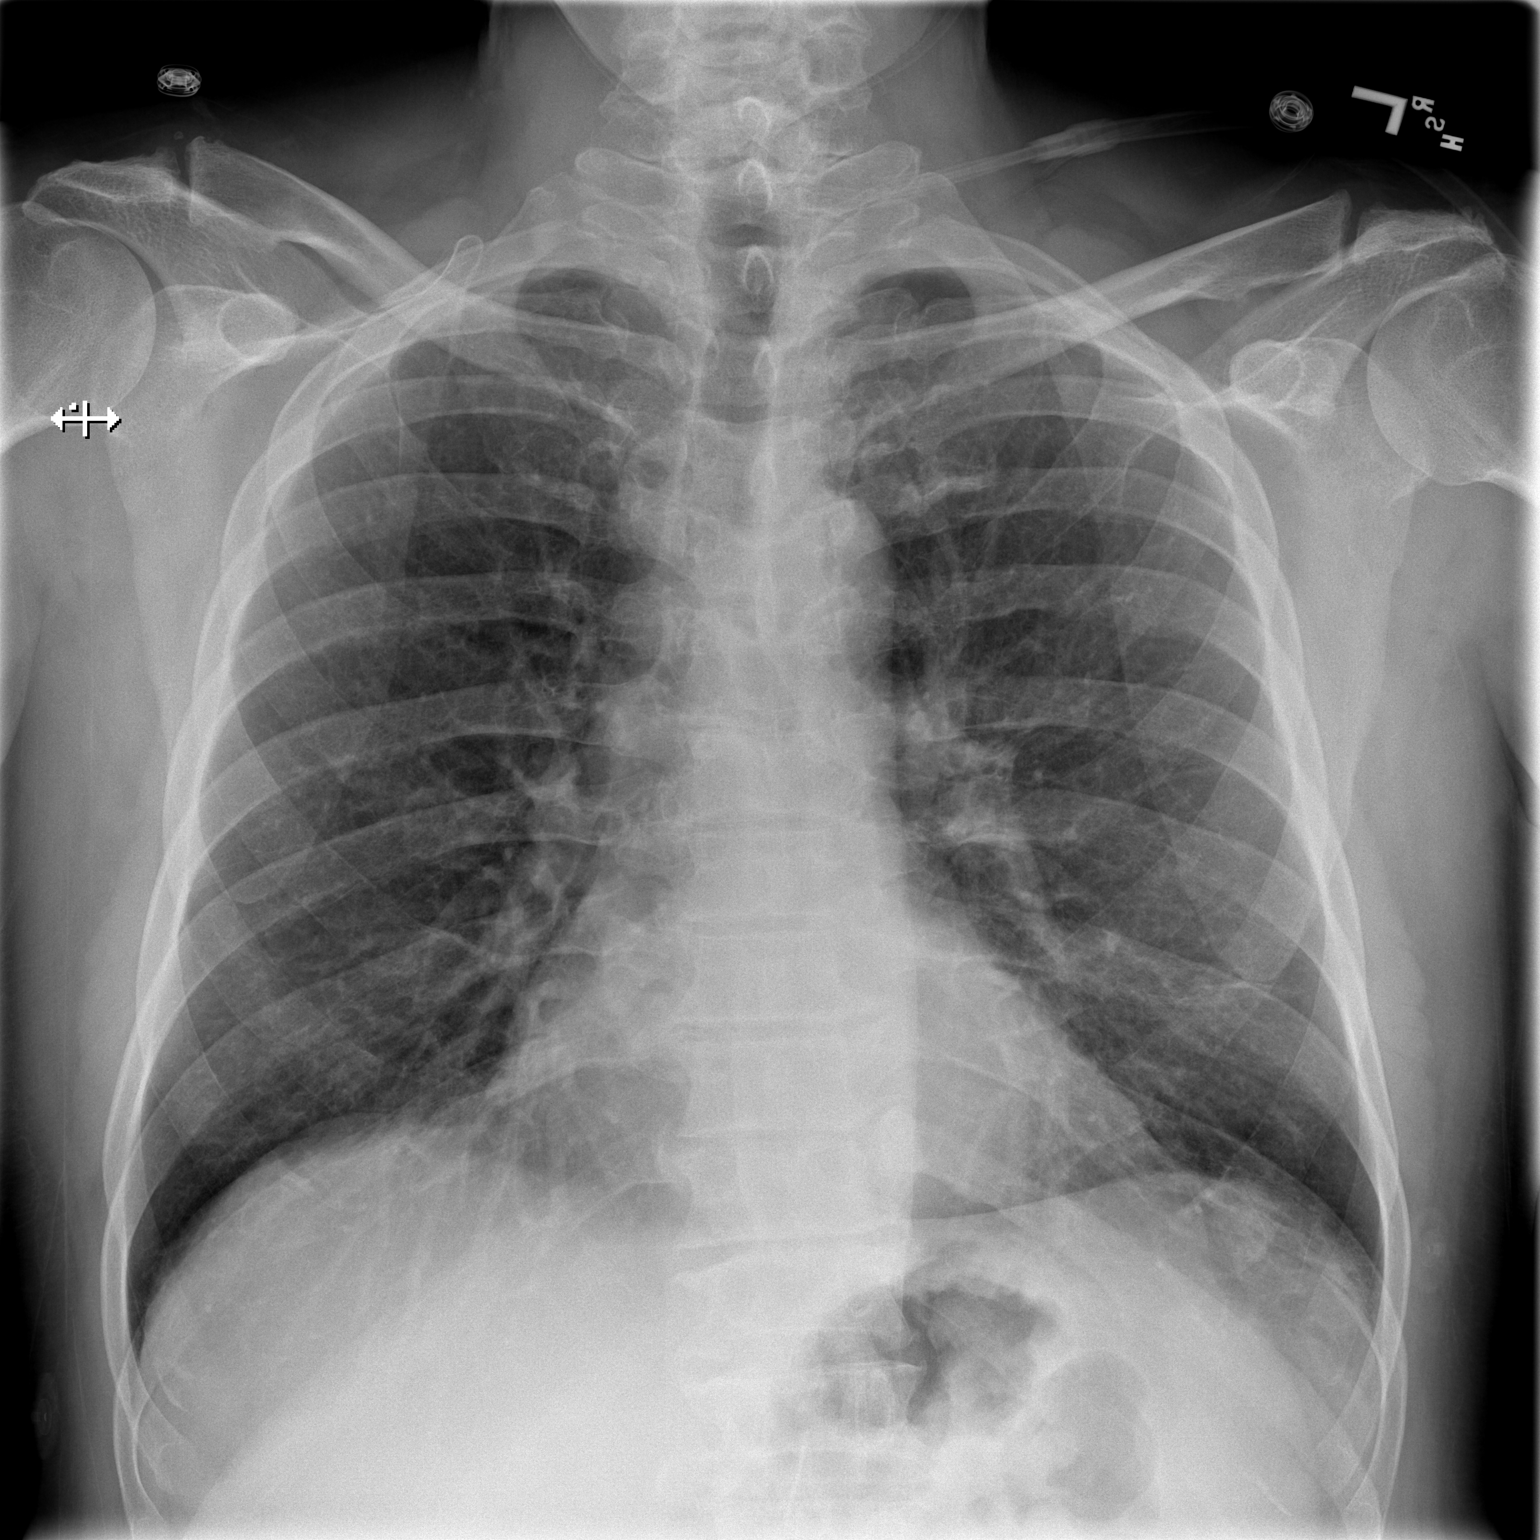

[w chest lat]
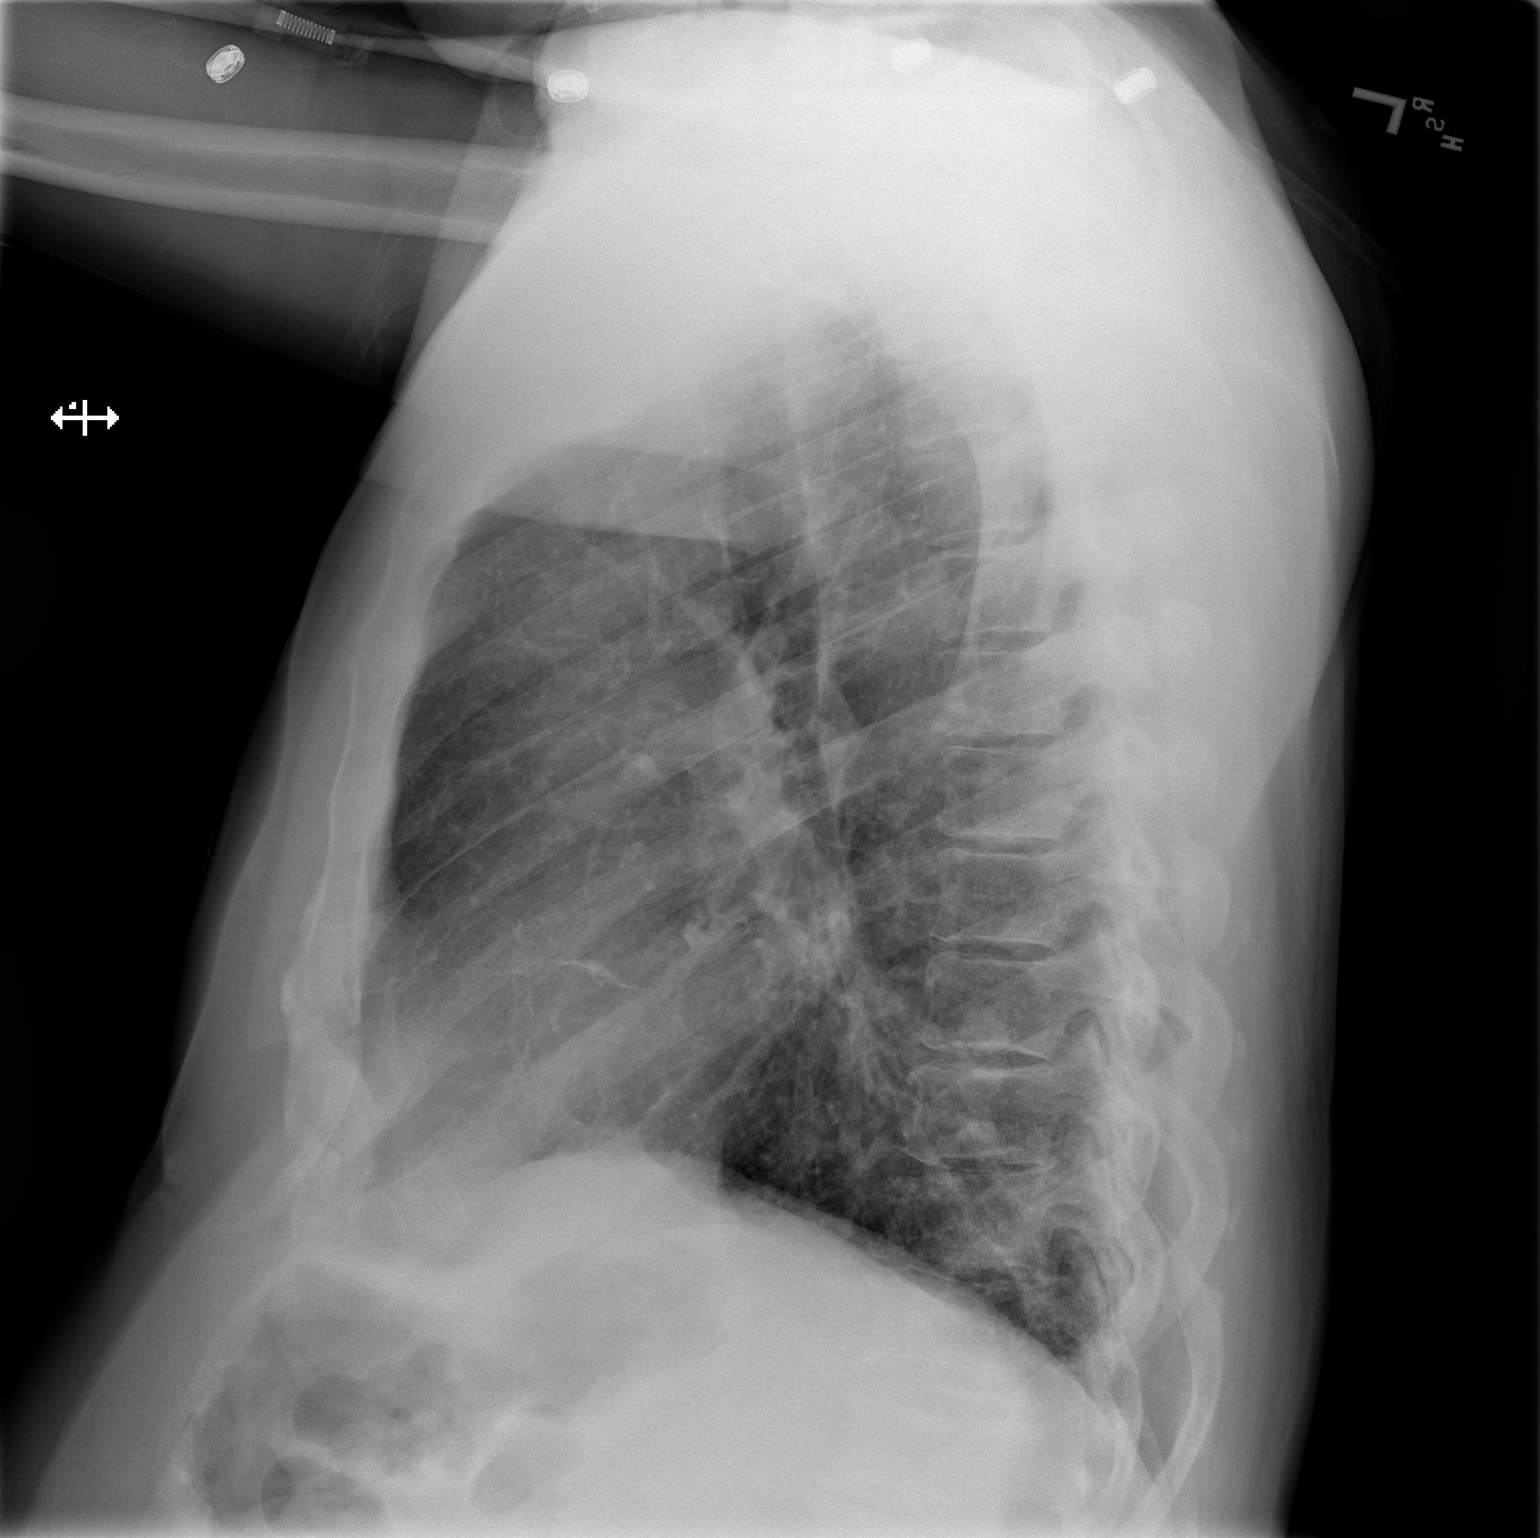

[2 of 2 positions shown; findings below may reference images not displayed]

FINDINGS: Normal heart size and vascularity.  Streaky bibasilar
densities compatible with atelectasis versus scarring.  No
superimposed pneumonia, collapse, consolidation, effusion or
pneumothorax.  Trachea midline.
IMPRESSION: Streaky basilar atelectasis.

## 2014-02-14 ENCOUNTER — Encounter (HOSPITAL_COMMUNITY): Payer: Self-pay | Admitting: Internal Medicine

## 2015-02-03 ENCOUNTER — Ambulatory Visit: Payer: Medicaid Other | Admitting: Podiatry

## 2015-08-08 DIAGNOSIS — M5116 Intervertebral disc disorders with radiculopathy, lumbar region: Secondary | ICD-10-CM | POA: Diagnosis not present

## 2015-08-08 DIAGNOSIS — M5136 Other intervertebral disc degeneration, lumbar region: Secondary | ICD-10-CM | POA: Diagnosis not present

## 2015-08-20 DIAGNOSIS — M5416 Radiculopathy, lumbar region: Secondary | ICD-10-CM | POA: Diagnosis not present

## 2015-08-29 DIAGNOSIS — I2510 Atherosclerotic heart disease of native coronary artery without angina pectoris: Secondary | ICD-10-CM | POA: Diagnosis not present

## 2015-08-29 DIAGNOSIS — E7800 Pure hypercholesterolemia, unspecified: Secondary | ICD-10-CM | POA: Diagnosis not present

## 2015-08-29 DIAGNOSIS — I10 Essential (primary) hypertension: Secondary | ICD-10-CM | POA: Diagnosis not present

## 2015-09-05 DIAGNOSIS — M5416 Radiculopathy, lumbar region: Secondary | ICD-10-CM | POA: Diagnosis not present

## 2015-09-16 DIAGNOSIS — M545 Low back pain: Secondary | ICD-10-CM | POA: Diagnosis not present

## 2015-09-22 DIAGNOSIS — M79604 Pain in right leg: Secondary | ICD-10-CM | POA: Diagnosis not present

## 2015-09-24 ENCOUNTER — Other Ambulatory Visit: Payer: Self-pay | Admitting: Orthopedic Surgery

## 2015-09-24 DIAGNOSIS — M5416 Radiculopathy, lumbar region: Secondary | ICD-10-CM

## 2015-09-25 DIAGNOSIS — I10 Essential (primary) hypertension: Secondary | ICD-10-CM | POA: Diagnosis not present

## 2015-09-25 DIAGNOSIS — I2510 Atherosclerotic heart disease of native coronary artery without angina pectoris: Secondary | ICD-10-CM | POA: Diagnosis not present

## 2015-09-25 DIAGNOSIS — Z9181 History of falling: Secondary | ICD-10-CM | POA: Diagnosis not present

## 2015-09-26 ENCOUNTER — Other Ambulatory Visit: Payer: Medicaid Other

## 2015-09-26 ENCOUNTER — Ambulatory Visit
Admission: RE | Admit: 2015-09-26 | Discharge: 2015-09-26 | Disposition: A | Discharge status: Home / Self Care | Payer: Medicare Other | Source: Ambulatory Visit | Attending: Orthopedic Surgery | Admitting: Orthopedic Surgery

## 2015-09-26 ENCOUNTER — Other Ambulatory Visit: Payer: Self-pay | Admitting: Radiology

## 2015-09-26 ENCOUNTER — Encounter: Payer: Self-pay | Admitting: Radiology

## 2015-09-26 DIAGNOSIS — M5416 Radiculopathy, lumbar region: Secondary | ICD-10-CM

## 2015-09-26 DIAGNOSIS — M5126 Other intervertebral disc displacement, lumbar region: Secondary | ICD-10-CM | POA: Diagnosis not present

## 2015-09-26 MED ORDER — IOPAMIDOL (ISOVUE-M 200) INJECTION 41%
1.0000 mL | Freq: Once | INTRAMUSCULAR | Status: AC
  Administered 2015-09-26: 1 mL via EPIDURAL

## 2015-09-26 MED ORDER — METHYLPREDNISOLONE ACETATE 40 MG/ML INJ SUSP (RADIOLOG
120.0000 mg | Freq: Once | INTRAMUSCULAR | Status: AC
  Administered 2015-09-26: 120 mg via EPIDURAL

## 2015-09-26 NOTE — Discharge Instructions (Signed)

## 2015-10-10 DIAGNOSIS — I252 Old myocardial infarction: Secondary | ICD-10-CM | POA: Diagnosis not present

## 2015-10-10 DIAGNOSIS — I10 Essential (primary) hypertension: Secondary | ICD-10-CM | POA: Diagnosis not present

## 2015-10-10 DIAGNOSIS — S8012XA Contusion of left lower leg, initial encounter: Secondary | ICD-10-CM | POA: Diagnosis not present

## 2015-10-10 DIAGNOSIS — M79605 Pain in left leg: Secondary | ICD-10-CM | POA: Diagnosis not present

## 2015-10-10 DIAGNOSIS — E785 Hyperlipidemia, unspecified: Secondary | ICD-10-CM | POA: Diagnosis not present

## 2015-10-10 DIAGNOSIS — Z7982 Long term (current) use of aspirin: Secondary | ICD-10-CM | POA: Diagnosis not present

## 2015-10-30 DIAGNOSIS — I10 Essential (primary) hypertension: Secondary | ICD-10-CM | POA: Diagnosis not present

## 2015-10-30 DIAGNOSIS — Z79891 Long term (current) use of opiate analgesic: Secondary | ICD-10-CM | POA: Diagnosis not present

## 2015-10-30 DIAGNOSIS — I2510 Atherosclerotic heart disease of native coronary artery without angina pectoris: Secondary | ICD-10-CM | POA: Diagnosis not present

## 2015-11-28 DIAGNOSIS — I2510 Atherosclerotic heart disease of native coronary artery without angina pectoris: Secondary | ICD-10-CM | POA: Diagnosis not present

## 2015-11-28 DIAGNOSIS — I10 Essential (primary) hypertension: Secondary | ICD-10-CM | POA: Diagnosis not present

## 2015-11-28 DIAGNOSIS — Z23 Encounter for immunization: Secondary | ICD-10-CM | POA: Diagnosis not present

## 2015-12-26 DIAGNOSIS — M25511 Pain in right shoulder: Secondary | ICD-10-CM | POA: Diagnosis not present

## 2015-12-26 DIAGNOSIS — S40811A Abrasion of right upper arm, initial encounter: Secondary | ICD-10-CM | POA: Diagnosis not present

## 2016-01-28 DIAGNOSIS — I10 Essential (primary) hypertension: Secondary | ICD-10-CM | POA: Diagnosis not present

## 2016-01-28 DIAGNOSIS — I2510 Atherosclerotic heart disease of native coronary artery without angina pectoris: Secondary | ICD-10-CM | POA: Diagnosis not present

## 2016-01-28 DIAGNOSIS — M159 Polyosteoarthritis, unspecified: Secondary | ICD-10-CM | POA: Diagnosis not present

## 2016-01-28 DIAGNOSIS — E7800 Pure hypercholesterolemia, unspecified: Secondary | ICD-10-CM | POA: Diagnosis not present

## 2016-02-27 DIAGNOSIS — I2510 Atherosclerotic heart disease of native coronary artery without angina pectoris: Secondary | ICD-10-CM | POA: Diagnosis not present

## 2016-02-27 DIAGNOSIS — M545 Low back pain: Secondary | ICD-10-CM | POA: Diagnosis not present

## 2016-02-27 DIAGNOSIS — L309 Dermatitis, unspecified: Secondary | ICD-10-CM | POA: Diagnosis not present

## 2016-02-27 DIAGNOSIS — I10 Essential (primary) hypertension: Secondary | ICD-10-CM | POA: Diagnosis not present

## 2016-03-15 DIAGNOSIS — M159 Polyosteoarthritis, unspecified: Secondary | ICD-10-CM | POA: Diagnosis not present

## 2016-03-15 DIAGNOSIS — I10 Essential (primary) hypertension: Secondary | ICD-10-CM | POA: Diagnosis not present

## 2016-03-15 DIAGNOSIS — I2510 Atherosclerotic heart disease of native coronary artery without angina pectoris: Secondary | ICD-10-CM | POA: Diagnosis not present

## 2016-03-16 DIAGNOSIS — H25813 Combined forms of age-related cataract, bilateral: Secondary | ICD-10-CM | POA: Diagnosis not present

## 2016-03-30 DIAGNOSIS — I2510 Atherosclerotic heart disease of native coronary artery without angina pectoris: Secondary | ICD-10-CM | POA: Diagnosis not present

## 2016-03-30 DIAGNOSIS — Z23 Encounter for immunization: Secondary | ICD-10-CM | POA: Diagnosis not present

## 2016-03-30 DIAGNOSIS — Z79891 Long term (current) use of opiate analgesic: Secondary | ICD-10-CM | POA: Diagnosis not present

## 2016-03-30 DIAGNOSIS — I10 Essential (primary) hypertension: Secondary | ICD-10-CM | POA: Diagnosis not present

## 2016-04-22 DIAGNOSIS — R1013 Epigastric pain: Secondary | ICD-10-CM | POA: Diagnosis not present

## 2016-04-22 DIAGNOSIS — Z1211 Encounter for screening for malignant neoplasm of colon: Secondary | ICD-10-CM | POA: Diagnosis not present

## 2016-04-23 DIAGNOSIS — R1013 Epigastric pain: Secondary | ICD-10-CM | POA: Diagnosis not present

## 2016-04-26 DIAGNOSIS — M545 Low back pain: Secondary | ICD-10-CM | POA: Diagnosis not present

## 2016-04-26 DIAGNOSIS — I2510 Atherosclerotic heart disease of native coronary artery without angina pectoris: Secondary | ICD-10-CM | POA: Diagnosis not present

## 2016-04-26 DIAGNOSIS — I10 Essential (primary) hypertension: Secondary | ICD-10-CM | POA: Diagnosis not present

## 2016-04-28 DIAGNOSIS — N200 Calculus of kidney: Secondary | ICD-10-CM | POA: Diagnosis not present

## 2016-04-28 DIAGNOSIS — R1013 Epigastric pain: Secondary | ICD-10-CM | POA: Diagnosis not present

## 2016-04-28 DIAGNOSIS — K760 Fatty (change of) liver, not elsewhere classified: Secondary | ICD-10-CM | POA: Diagnosis not present

## 2016-04-30 DIAGNOSIS — Z1211 Encounter for screening for malignant neoplasm of colon: Secondary | ICD-10-CM | POA: Diagnosis not present

## 2016-04-30 DIAGNOSIS — R1013 Epigastric pain: Secondary | ICD-10-CM | POA: Diagnosis not present

## 2016-05-05 DIAGNOSIS — K635 Polyp of colon: Secondary | ICD-10-CM | POA: Diagnosis not present

## 2016-05-05 DIAGNOSIS — Z1211 Encounter for screening for malignant neoplasm of colon: Secondary | ICD-10-CM | POA: Diagnosis not present

## 2016-05-05 DIAGNOSIS — D124 Benign neoplasm of descending colon: Secondary | ICD-10-CM | POA: Diagnosis not present

## 2016-05-05 DIAGNOSIS — D123 Benign neoplasm of transverse colon: Secondary | ICD-10-CM | POA: Diagnosis not present

## 2016-05-18 DIAGNOSIS — R1013 Epigastric pain: Secondary | ICD-10-CM | POA: Diagnosis not present

## 2016-05-18 DIAGNOSIS — K5900 Constipation, unspecified: Secondary | ICD-10-CM | POA: Diagnosis not present

## 2016-05-24 DIAGNOSIS — I10 Essential (primary) hypertension: Secondary | ICD-10-CM | POA: Diagnosis not present

## 2016-05-24 DIAGNOSIS — M159 Polyosteoarthritis, unspecified: Secondary | ICD-10-CM | POA: Diagnosis not present

## 2016-05-24 DIAGNOSIS — I2510 Atherosclerotic heart disease of native coronary artery without angina pectoris: Secondary | ICD-10-CM | POA: Diagnosis not present

## 2016-05-31 DIAGNOSIS — I10 Essential (primary) hypertension: Secondary | ICD-10-CM | POA: Diagnosis not present

## 2016-05-31 DIAGNOSIS — E7800 Pure hypercholesterolemia, unspecified: Secondary | ICD-10-CM | POA: Diagnosis not present

## 2016-05-31 DIAGNOSIS — I2510 Atherosclerotic heart disease of native coronary artery without angina pectoris: Secondary | ICD-10-CM | POA: Diagnosis not present

## 2016-05-31 DIAGNOSIS — M545 Low back pain: Secondary | ICD-10-CM | POA: Diagnosis not present

## 2016-06-22 DIAGNOSIS — E7800 Pure hypercholesterolemia, unspecified: Secondary | ICD-10-CM | POA: Diagnosis not present

## 2016-06-22 DIAGNOSIS — I2510 Atherosclerotic heart disease of native coronary artery without angina pectoris: Secondary | ICD-10-CM | POA: Diagnosis not present

## 2016-06-22 DIAGNOSIS — M545 Low back pain: Secondary | ICD-10-CM | POA: Diagnosis not present

## 2016-06-29 DIAGNOSIS — I2510 Atherosclerotic heart disease of native coronary artery without angina pectoris: Secondary | ICD-10-CM | POA: Diagnosis not present

## 2016-06-29 DIAGNOSIS — M545 Low back pain: Secondary | ICD-10-CM | POA: Diagnosis not present

## 2016-06-29 DIAGNOSIS — E7800 Pure hypercholesterolemia, unspecified: Secondary | ICD-10-CM | POA: Diagnosis not present

## 2016-06-30 DIAGNOSIS — M545 Low back pain: Secondary | ICD-10-CM | POA: Diagnosis not present

## 2016-06-30 DIAGNOSIS — M5136 Other intervertebral disc degeneration, lumbar region: Secondary | ICD-10-CM | POA: Diagnosis not present

## 2016-07-28 DIAGNOSIS — M545 Low back pain: Secondary | ICD-10-CM | POA: Diagnosis not present

## 2016-07-28 DIAGNOSIS — E7800 Pure hypercholesterolemia, unspecified: Secondary | ICD-10-CM | POA: Diagnosis not present

## 2016-07-28 DIAGNOSIS — I2510 Atherosclerotic heart disease of native coronary artery without angina pectoris: Secondary | ICD-10-CM | POA: Diagnosis not present

## 2016-09-02 DIAGNOSIS — Z139 Encounter for screening, unspecified: Secondary | ICD-10-CM | POA: Diagnosis not present

## 2016-09-02 DIAGNOSIS — Z1389 Encounter for screening for other disorder: Secondary | ICD-10-CM | POA: Diagnosis not present

## 2016-09-02 DIAGNOSIS — I2510 Atherosclerotic heart disease of native coronary artery without angina pectoris: Secondary | ICD-10-CM | POA: Diagnosis not present

## 2016-09-02 DIAGNOSIS — E7800 Pure hypercholesterolemia, unspecified: Secondary | ICD-10-CM | POA: Diagnosis not present

## 2016-09-02 DIAGNOSIS — I10 Essential (primary) hypertension: Secondary | ICD-10-CM | POA: Diagnosis not present

## 2016-10-01 DIAGNOSIS — E7800 Pure hypercholesterolemia, unspecified: Secondary | ICD-10-CM | POA: Diagnosis not present

## 2016-10-01 DIAGNOSIS — I2510 Atherosclerotic heart disease of native coronary artery without angina pectoris: Secondary | ICD-10-CM | POA: Diagnosis not present

## 2016-10-01 DIAGNOSIS — Z9181 History of falling: Secondary | ICD-10-CM | POA: Diagnosis not present

## 2016-10-01 DIAGNOSIS — H6122 Impacted cerumen, left ear: Secondary | ICD-10-CM | POA: Diagnosis not present

## 2016-10-29 DIAGNOSIS — E7800 Pure hypercholesterolemia, unspecified: Secondary | ICD-10-CM | POA: Diagnosis not present

## 2016-10-29 DIAGNOSIS — I2510 Atherosclerotic heart disease of native coronary artery without angina pectoris: Secondary | ICD-10-CM | POA: Diagnosis not present

## 2016-10-29 DIAGNOSIS — I10 Essential (primary) hypertension: Secondary | ICD-10-CM | POA: Diagnosis not present

## 2017-01-10 DIAGNOSIS — G4733 Obstructive sleep apnea (adult) (pediatric): Secondary | ICD-10-CM | POA: Diagnosis not present

## 2017-01-10 DIAGNOSIS — J449 Chronic obstructive pulmonary disease, unspecified: Secondary | ICD-10-CM | POA: Diagnosis not present

## 2017-01-10 DIAGNOSIS — J301 Allergic rhinitis due to pollen: Secondary | ICD-10-CM | POA: Diagnosis not present

## 2017-01-10 DIAGNOSIS — R5383 Other fatigue: Secondary | ICD-10-CM | POA: Diagnosis not present

## 2017-01-10 DIAGNOSIS — E559 Vitamin D deficiency, unspecified: Secondary | ICD-10-CM | POA: Diagnosis not present

## 2017-01-11 DIAGNOSIS — G4733 Obstructive sleep apnea (adult) (pediatric): Secondary | ICD-10-CM | POA: Diagnosis not present

## 2017-01-17 DIAGNOSIS — G4733 Obstructive sleep apnea (adult) (pediatric): Secondary | ICD-10-CM | POA: Diagnosis not present

## 2017-01-17 DIAGNOSIS — R5383 Other fatigue: Secondary | ICD-10-CM | POA: Diagnosis not present

## 2017-01-17 DIAGNOSIS — J449 Chronic obstructive pulmonary disease, unspecified: Secondary | ICD-10-CM | POA: Diagnosis not present

## 2017-01-17 DIAGNOSIS — J301 Allergic rhinitis due to pollen: Secondary | ICD-10-CM | POA: Diagnosis not present

## 2017-01-23 DIAGNOSIS — G4733 Obstructive sleep apnea (adult) (pediatric): Secondary | ICD-10-CM | POA: Diagnosis not present

## 2017-01-31 DIAGNOSIS — R5383 Other fatigue: Secondary | ICD-10-CM | POA: Diagnosis not present

## 2017-01-31 DIAGNOSIS — J301 Allergic rhinitis due to pollen: Secondary | ICD-10-CM | POA: Diagnosis not present

## 2017-01-31 DIAGNOSIS — J449 Chronic obstructive pulmonary disease, unspecified: Secondary | ICD-10-CM | POA: Diagnosis not present

## 2017-01-31 DIAGNOSIS — G4733 Obstructive sleep apnea (adult) (pediatric): Secondary | ICD-10-CM | POA: Diagnosis not present

## 2017-02-21 DIAGNOSIS — G4733 Obstructive sleep apnea (adult) (pediatric): Secondary | ICD-10-CM | POA: Diagnosis not present

## 2017-03-07 DIAGNOSIS — E7800 Pure hypercholesterolemia, unspecified: Secondary | ICD-10-CM | POA: Diagnosis not present

## 2017-03-07 DIAGNOSIS — M159 Polyosteoarthritis, unspecified: Secondary | ICD-10-CM | POA: Diagnosis not present

## 2017-03-07 DIAGNOSIS — I10 Essential (primary) hypertension: Secondary | ICD-10-CM | POA: Diagnosis not present

## 2017-03-16 DIAGNOSIS — E7800 Pure hypercholesterolemia, unspecified: Secondary | ICD-10-CM | POA: Diagnosis not present

## 2017-03-16 DIAGNOSIS — I10 Essential (primary) hypertension: Secondary | ICD-10-CM | POA: Diagnosis not present

## 2017-03-16 DIAGNOSIS — M545 Low back pain: Secondary | ICD-10-CM | POA: Diagnosis not present

## 2017-03-22 DIAGNOSIS — Z125 Encounter for screening for malignant neoplasm of prostate: Secondary | ICD-10-CM | POA: Diagnosis not present

## 2017-03-22 DIAGNOSIS — Z1331 Encounter for screening for depression: Secondary | ICD-10-CM | POA: Diagnosis not present

## 2017-03-22 DIAGNOSIS — Z9181 History of falling: Secondary | ICD-10-CM | POA: Diagnosis not present

## 2017-03-22 DIAGNOSIS — E785 Hyperlipidemia, unspecified: Secondary | ICD-10-CM | POA: Diagnosis not present

## 2017-03-22 DIAGNOSIS — Z0000 Encounter for general adult medical examination without abnormal findings: Secondary | ICD-10-CM | POA: Diagnosis not present

## 2017-03-22 DIAGNOSIS — G4733 Obstructive sleep apnea (adult) (pediatric): Secondary | ICD-10-CM | POA: Diagnosis not present

## 2017-03-22 DIAGNOSIS — J449 Chronic obstructive pulmonary disease, unspecified: Secondary | ICD-10-CM | POA: Diagnosis not present

## 2017-03-24 DIAGNOSIS — G4733 Obstructive sleep apnea (adult) (pediatric): Secondary | ICD-10-CM | POA: Diagnosis not present

## 2017-03-24 DIAGNOSIS — E7800 Pure hypercholesterolemia, unspecified: Secondary | ICD-10-CM | POA: Diagnosis not present

## 2017-03-24 DIAGNOSIS — J208 Acute bronchitis due to other specified organisms: Secondary | ICD-10-CM | POA: Diagnosis not present

## 2017-03-24 DIAGNOSIS — M159 Polyosteoarthritis, unspecified: Secondary | ICD-10-CM | POA: Diagnosis not present

## 2017-04-06 DIAGNOSIS — E7800 Pure hypercholesterolemia, unspecified: Secondary | ICD-10-CM | POA: Diagnosis not present

## 2017-04-06 DIAGNOSIS — M545 Low back pain: Secondary | ICD-10-CM | POA: Diagnosis not present

## 2017-04-06 DIAGNOSIS — M159 Polyosteoarthritis, unspecified: Secondary | ICD-10-CM | POA: Diagnosis not present

## 2017-04-06 DIAGNOSIS — I10 Essential (primary) hypertension: Secondary | ICD-10-CM | POA: Diagnosis not present

## 2017-04-24 DIAGNOSIS — G4733 Obstructive sleep apnea (adult) (pediatric): Secondary | ICD-10-CM | POA: Diagnosis not present

## 2017-05-10 DIAGNOSIS — I252 Old myocardial infarction: Secondary | ICD-10-CM | POA: Diagnosis not present

## 2017-05-10 DIAGNOSIS — I10 Essential (primary) hypertension: Secondary | ICD-10-CM | POA: Diagnosis not present

## 2017-05-10 DIAGNOSIS — R0902 Hypoxemia: Secondary | ICD-10-CM | POA: Diagnosis not present

## 2017-05-10 DIAGNOSIS — J9601 Acute respiratory failure with hypoxia: Secondary | ICD-10-CM | POA: Diagnosis not present

## 2017-05-10 DIAGNOSIS — R0602 Shortness of breath: Secondary | ICD-10-CM | POA: Diagnosis not present

## 2017-05-10 DIAGNOSIS — Z7982 Long term (current) use of aspirin: Secondary | ICD-10-CM | POA: Diagnosis not present

## 2017-05-10 DIAGNOSIS — K639 Disease of intestine, unspecified: Secondary | ICD-10-CM | POA: Diagnosis not present

## 2017-05-10 DIAGNOSIS — C188 Malignant neoplasm of overlapping sites of colon: Secondary | ICD-10-CM | POA: Diagnosis not present

## 2017-05-10 DIAGNOSIS — Z79899 Other long term (current) drug therapy: Secondary | ICD-10-CM | POA: Diagnosis not present

## 2017-05-10 DIAGNOSIS — J441 Chronic obstructive pulmonary disease with (acute) exacerbation: Secondary | ICD-10-CM | POA: Diagnosis not present

## 2017-05-10 DIAGNOSIS — C180 Malignant neoplasm of cecum: Secondary | ICD-10-CM | POA: Diagnosis not present

## 2017-05-10 DIAGNOSIS — E7800 Pure hypercholesterolemia, unspecified: Secondary | ICD-10-CM | POA: Diagnosis not present

## 2017-05-10 DIAGNOSIS — K3532 Acute appendicitis with perforation and localized peritonitis, without abscess: Secondary | ICD-10-CM | POA: Diagnosis not present

## 2017-05-10 DIAGNOSIS — R55 Syncope and collapse: Secondary | ICD-10-CM | POA: Diagnosis not present

## 2017-05-10 DIAGNOSIS — I2510 Atherosclerotic heart disease of native coronary artery without angina pectoris: Secondary | ICD-10-CM | POA: Diagnosis not present

## 2017-05-10 DIAGNOSIS — K5900 Constipation, unspecified: Secondary | ICD-10-CM | POA: Diagnosis not present

## 2017-05-10 DIAGNOSIS — E785 Hyperlipidemia, unspecified: Secondary | ICD-10-CM | POA: Diagnosis not present

## 2017-05-10 DIAGNOSIS — M1990 Unspecified osteoarthritis, unspecified site: Secondary | ICD-10-CM | POA: Diagnosis not present

## 2017-05-10 DIAGNOSIS — Z955 Presence of coronary angioplasty implant and graft: Secondary | ICD-10-CM | POA: Diagnosis not present

## 2017-05-10 DIAGNOSIS — C772 Secondary and unspecified malignant neoplasm of intra-abdominal lymph nodes: Secondary | ICD-10-CM | POA: Diagnosis not present

## 2017-05-10 DIAGNOSIS — R109 Unspecified abdominal pain: Secondary | ICD-10-CM | POA: Diagnosis not present

## 2017-05-10 DIAGNOSIS — K3580 Unspecified acute appendicitis: Secondary | ICD-10-CM | POA: Diagnosis not present

## 2017-05-10 DIAGNOSIS — J9811 Atelectasis: Secondary | ICD-10-CM | POA: Diagnosis not present

## 2017-05-10 DIAGNOSIS — Z5331 Laparoscopic surgical procedure converted to open procedure: Secondary | ICD-10-CM | POA: Diagnosis not present

## 2017-05-10 DIAGNOSIS — R0603 Acute respiratory distress: Secondary | ICD-10-CM | POA: Diagnosis not present

## 2017-05-11 DIAGNOSIS — K3580 Unspecified acute appendicitis: Secondary | ICD-10-CM | POA: Diagnosis not present

## 2017-05-11 DIAGNOSIS — J9601 Acute respiratory failure with hypoxia: Secondary | ICD-10-CM | POA: Diagnosis not present

## 2017-05-20 DIAGNOSIS — M545 Low back pain: Secondary | ICD-10-CM | POA: Diagnosis not present

## 2017-05-20 DIAGNOSIS — R110 Nausea: Secondary | ICD-10-CM | POA: Diagnosis not present

## 2017-05-20 DIAGNOSIS — C180 Malignant neoplasm of cecum: Secondary | ICD-10-CM | POA: Diagnosis not present

## 2017-05-20 DIAGNOSIS — E7800 Pure hypercholesterolemia, unspecified: Secondary | ICD-10-CM | POA: Diagnosis not present

## 2017-05-20 DIAGNOSIS — K3532 Acute appendicitis with perforation and localized peritonitis, without abscess: Secondary | ICD-10-CM | POA: Diagnosis not present

## 2017-05-22 DIAGNOSIS — G4733 Obstructive sleep apnea (adult) (pediatric): Secondary | ICD-10-CM | POA: Diagnosis not present

## 2017-05-25 DIAGNOSIS — C180 Malignant neoplasm of cecum: Secondary | ICD-10-CM | POA: Diagnosis not present

## 2017-05-26 DIAGNOSIS — M545 Low back pain: Secondary | ICD-10-CM | POA: Diagnosis not present

## 2017-05-26 DIAGNOSIS — E7800 Pure hypercholesterolemia, unspecified: Secondary | ICD-10-CM | POA: Diagnosis not present

## 2017-05-26 DIAGNOSIS — C180 Malignant neoplasm of cecum: Secondary | ICD-10-CM | POA: Diagnosis not present

## 2017-06-02 DIAGNOSIS — R978 Other abnormal tumor markers: Secondary | ICD-10-CM | POA: Diagnosis not present

## 2017-06-02 DIAGNOSIS — C188 Malignant neoplasm of overlapping sites of colon: Secondary | ICD-10-CM | POA: Diagnosis not present

## 2017-06-02 DIAGNOSIS — C180 Malignant neoplasm of cecum: Secondary | ICD-10-CM | POA: Diagnosis not present

## 2017-06-02 DIAGNOSIS — Z9049 Acquired absence of other specified parts of digestive tract: Secondary | ICD-10-CM | POA: Diagnosis not present

## 2017-06-03 DIAGNOSIS — C180 Malignant neoplasm of cecum: Secondary | ICD-10-CM | POA: Diagnosis not present

## 2017-06-06 DIAGNOSIS — Z452 Encounter for adjustment and management of vascular access device: Secondary | ICD-10-CM | POA: Diagnosis not present

## 2017-06-06 DIAGNOSIS — I2510 Atherosclerotic heart disease of native coronary artery without angina pectoris: Secondary | ICD-10-CM | POA: Diagnosis not present

## 2017-06-06 DIAGNOSIS — Z955 Presence of coronary angioplasty implant and graft: Secondary | ICD-10-CM | POA: Diagnosis not present

## 2017-06-06 DIAGNOSIS — I252 Old myocardial infarction: Secondary | ICD-10-CM | POA: Diagnosis not present

## 2017-06-06 DIAGNOSIS — G4730 Sleep apnea, unspecified: Secondary | ICD-10-CM | POA: Diagnosis not present

## 2017-06-06 DIAGNOSIS — Z7982 Long term (current) use of aspirin: Secondary | ICD-10-CM | POA: Diagnosis not present

## 2017-06-06 DIAGNOSIS — E7800 Pure hypercholesterolemia, unspecified: Secondary | ICD-10-CM | POA: Diagnosis not present

## 2017-06-06 DIAGNOSIS — Z87891 Personal history of nicotine dependence: Secondary | ICD-10-CM | POA: Diagnosis not present

## 2017-06-06 DIAGNOSIS — J449 Chronic obstructive pulmonary disease, unspecified: Secondary | ICD-10-CM | POA: Diagnosis not present

## 2017-06-06 DIAGNOSIS — M1990 Unspecified osteoarthritis, unspecified site: Secondary | ICD-10-CM | POA: Diagnosis not present

## 2017-06-06 DIAGNOSIS — C180 Malignant neoplasm of cecum: Secondary | ICD-10-CM | POA: Diagnosis not present

## 2017-06-06 DIAGNOSIS — Z79899 Other long term (current) drug therapy: Secondary | ICD-10-CM | POA: Diagnosis not present

## 2017-06-06 DIAGNOSIS — C182 Malignant neoplasm of ascending colon: Secondary | ICD-10-CM | POA: Diagnosis not present

## 2017-06-06 DIAGNOSIS — I10 Essential (primary) hypertension: Secondary | ICD-10-CM | POA: Diagnosis not present

## 2017-06-14 DIAGNOSIS — C180 Malignant neoplasm of cecum: Secondary | ICD-10-CM | POA: Diagnosis not present

## 2017-06-15 DIAGNOSIS — C188 Malignant neoplasm of overlapping sites of colon: Secondary | ICD-10-CM | POA: Diagnosis not present

## 2017-06-15 DIAGNOSIS — Z5111 Encounter for antineoplastic chemotherapy: Secondary | ICD-10-CM | POA: Diagnosis not present

## 2017-06-17 DIAGNOSIS — C188 Malignant neoplasm of overlapping sites of colon: Secondary | ICD-10-CM | POA: Diagnosis not present

## 2017-06-17 DIAGNOSIS — Z452 Encounter for adjustment and management of vascular access device: Secondary | ICD-10-CM | POA: Diagnosis not present

## 2017-06-21 DIAGNOSIS — Z09 Encounter for follow-up examination after completed treatment for conditions other than malignant neoplasm: Secondary | ICD-10-CM | POA: Diagnosis not present

## 2017-06-22 DIAGNOSIS — C188 Malignant neoplasm of overlapping sites of colon: Secondary | ICD-10-CM | POA: Diagnosis not present

## 2017-06-23 DIAGNOSIS — E7800 Pure hypercholesterolemia, unspecified: Secondary | ICD-10-CM | POA: Diagnosis not present

## 2017-06-23 DIAGNOSIS — M545 Low back pain: Secondary | ICD-10-CM | POA: Diagnosis not present

## 2017-06-23 DIAGNOSIS — M159 Polyosteoarthritis, unspecified: Secondary | ICD-10-CM | POA: Diagnosis not present

## 2017-06-23 DIAGNOSIS — C180 Malignant neoplasm of cecum: Secondary | ICD-10-CM | POA: Diagnosis not present

## 2017-06-29 DIAGNOSIS — D701 Agranulocytosis secondary to cancer chemotherapy: Secondary | ICD-10-CM | POA: Diagnosis not present

## 2017-06-29 DIAGNOSIS — D709 Neutropenia, unspecified: Secondary | ICD-10-CM | POA: Diagnosis not present

## 2017-06-29 DIAGNOSIS — C180 Malignant neoplasm of cecum: Secondary | ICD-10-CM | POA: Diagnosis not present

## 2017-06-29 DIAGNOSIS — Z9049 Acquired absence of other specified parts of digestive tract: Secondary | ICD-10-CM | POA: Diagnosis not present

## 2017-06-29 DIAGNOSIS — K1231 Oral mucositis (ulcerative) due to antineoplastic therapy: Secondary | ICD-10-CM | POA: Diagnosis not present

## 2017-06-29 DIAGNOSIS — R197 Diarrhea, unspecified: Secondary | ICD-10-CM | POA: Diagnosis not present

## 2017-06-29 DIAGNOSIS — E538 Deficiency of other specified B group vitamins: Secondary | ICD-10-CM | POA: Diagnosis not present

## 2017-06-29 DIAGNOSIS — C188 Malignant neoplasm of overlapping sites of colon: Secondary | ICD-10-CM | POA: Diagnosis not present

## 2017-07-06 DIAGNOSIS — C188 Malignant neoplasm of overlapping sites of colon: Secondary | ICD-10-CM | POA: Diagnosis not present

## 2017-07-06 DIAGNOSIS — Z5111 Encounter for antineoplastic chemotherapy: Secondary | ICD-10-CM | POA: Diagnosis not present

## 2017-07-08 DIAGNOSIS — C188 Malignant neoplasm of overlapping sites of colon: Secondary | ICD-10-CM | POA: Diagnosis not present

## 2017-07-08 DIAGNOSIS — Z452 Encounter for adjustment and management of vascular access device: Secondary | ICD-10-CM | POA: Diagnosis not present

## 2017-07-11 DIAGNOSIS — C180 Malignant neoplasm of cecum: Secondary | ICD-10-CM | POA: Diagnosis not present

## 2017-07-11 DIAGNOSIS — M545 Low back pain: Secondary | ICD-10-CM | POA: Diagnosis not present

## 2017-07-11 DIAGNOSIS — E7800 Pure hypercholesterolemia, unspecified: Secondary | ICD-10-CM | POA: Diagnosis not present

## 2017-07-11 DIAGNOSIS — M159 Polyosteoarthritis, unspecified: Secondary | ICD-10-CM | POA: Diagnosis not present

## 2017-07-15 DIAGNOSIS — C188 Malignant neoplasm of overlapping sites of colon: Secondary | ICD-10-CM | POA: Diagnosis not present

## 2017-07-20 DIAGNOSIS — R300 Dysuria: Secondary | ICD-10-CM | POA: Diagnosis not present

## 2017-07-20 DIAGNOSIS — C188 Malignant neoplasm of overlapping sites of colon: Secondary | ICD-10-CM | POA: Diagnosis not present

## 2017-07-21 DIAGNOSIS — M545 Low back pain: Secondary | ICD-10-CM | POA: Diagnosis not present

## 2017-07-21 DIAGNOSIS — C188 Malignant neoplasm of overlapping sites of colon: Secondary | ICD-10-CM | POA: Diagnosis not present

## 2017-07-21 DIAGNOSIS — C189 Malignant neoplasm of colon, unspecified: Secondary | ICD-10-CM | POA: Diagnosis not present

## 2017-07-22 DIAGNOSIS — C188 Malignant neoplasm of overlapping sites of colon: Secondary | ICD-10-CM | POA: Diagnosis not present

## 2017-08-03 DIAGNOSIS — M545 Low back pain: Secondary | ICD-10-CM | POA: Diagnosis not present

## 2017-08-03 DIAGNOSIS — C188 Malignant neoplasm of overlapping sites of colon: Secondary | ICD-10-CM | POA: Diagnosis not present

## 2017-08-03 DIAGNOSIS — Z7982 Long term (current) use of aspirin: Secondary | ICD-10-CM | POA: Diagnosis not present

## 2017-08-05 DIAGNOSIS — C188 Malignant neoplasm of overlapping sites of colon: Secondary | ICD-10-CM | POA: Diagnosis not present

## 2017-08-11 DIAGNOSIS — E7800 Pure hypercholesterolemia, unspecified: Secondary | ICD-10-CM | POA: Diagnosis not present

## 2017-08-11 DIAGNOSIS — M545 Low back pain: Secondary | ICD-10-CM | POA: Diagnosis not present

## 2017-08-11 DIAGNOSIS — M159 Polyosteoarthritis, unspecified: Secondary | ICD-10-CM | POA: Diagnosis not present

## 2017-08-11 DIAGNOSIS — I10 Essential (primary) hypertension: Secondary | ICD-10-CM | POA: Diagnosis not present

## 2017-08-11 DIAGNOSIS — C180 Malignant neoplasm of cecum: Secondary | ICD-10-CM | POA: Diagnosis not present

## 2017-08-11 DIAGNOSIS — K219 Gastro-esophageal reflux disease without esophagitis: Secondary | ICD-10-CM | POA: Diagnosis not present

## 2017-08-15 DIAGNOSIS — C188 Malignant neoplasm of overlapping sites of colon: Secondary | ICD-10-CM | POA: Diagnosis not present

## 2017-08-17 DIAGNOSIS — C188 Malignant neoplasm of overlapping sites of colon: Secondary | ICD-10-CM | POA: Diagnosis not present

## 2017-08-17 DIAGNOSIS — C180 Malignant neoplasm of cecum: Secondary | ICD-10-CM | POA: Diagnosis not present

## 2017-08-17 DIAGNOSIS — Z9049 Acquired absence of other specified parts of digestive tract: Secondary | ICD-10-CM | POA: Diagnosis not present

## 2017-08-19 DIAGNOSIS — Z452 Encounter for adjustment and management of vascular access device: Secondary | ICD-10-CM | POA: Diagnosis not present

## 2017-08-19 DIAGNOSIS — C188 Malignant neoplasm of overlapping sites of colon: Secondary | ICD-10-CM | POA: Diagnosis not present

## 2017-08-31 DIAGNOSIS — Z7982 Long term (current) use of aspirin: Secondary | ICD-10-CM | POA: Diagnosis not present

## 2017-08-31 DIAGNOSIS — Z86718 Personal history of other venous thrombosis and embolism: Secondary | ICD-10-CM | POA: Diagnosis not present

## 2017-08-31 DIAGNOSIS — C188 Malignant neoplasm of overlapping sites of colon: Secondary | ICD-10-CM | POA: Diagnosis not present

## 2017-09-02 DIAGNOSIS — C188 Malignant neoplasm of overlapping sites of colon: Secondary | ICD-10-CM | POA: Diagnosis not present

## 2017-09-02 DIAGNOSIS — Z452 Encounter for adjustment and management of vascular access device: Secondary | ICD-10-CM | POA: Diagnosis not present

## 2017-09-12 DIAGNOSIS — C180 Malignant neoplasm of cecum: Secondary | ICD-10-CM | POA: Diagnosis not present

## 2017-09-12 DIAGNOSIS — M545 Low back pain: Secondary | ICD-10-CM | POA: Diagnosis not present

## 2017-09-12 DIAGNOSIS — I2510 Atherosclerotic heart disease of native coronary artery without angina pectoris: Secondary | ICD-10-CM | POA: Diagnosis not present

## 2017-09-12 DIAGNOSIS — M159 Polyosteoarthritis, unspecified: Secondary | ICD-10-CM | POA: Diagnosis not present

## 2017-09-14 DIAGNOSIS — C188 Malignant neoplasm of overlapping sites of colon: Secondary | ICD-10-CM | POA: Diagnosis not present

## 2017-09-21 DIAGNOSIS — I82291 Chronic embolism and thrombosis of other thoracic veins: Secondary | ICD-10-CM | POA: Diagnosis not present

## 2017-09-21 DIAGNOSIS — C188 Malignant neoplasm of overlapping sites of colon: Secondary | ICD-10-CM | POA: Diagnosis not present

## 2017-09-23 DIAGNOSIS — Z452 Encounter for adjustment and management of vascular access device: Secondary | ICD-10-CM | POA: Diagnosis not present

## 2017-09-23 DIAGNOSIS — C188 Malignant neoplasm of overlapping sites of colon: Secondary | ICD-10-CM | POA: Diagnosis not present

## 2017-10-05 DIAGNOSIS — C188 Malignant neoplasm of overlapping sites of colon: Secondary | ICD-10-CM | POA: Diagnosis not present

## 2017-10-07 DIAGNOSIS — C188 Malignant neoplasm of overlapping sites of colon: Secondary | ICD-10-CM | POA: Diagnosis not present

## 2017-10-07 DIAGNOSIS — Z452 Encounter for adjustment and management of vascular access device: Secondary | ICD-10-CM | POA: Diagnosis not present

## 2017-10-11 DIAGNOSIS — I2510 Atherosclerotic heart disease of native coronary artery without angina pectoris: Secondary | ICD-10-CM | POA: Diagnosis not present

## 2017-10-11 DIAGNOSIS — M159 Polyosteoarthritis, unspecified: Secondary | ICD-10-CM | POA: Diagnosis not present

## 2017-10-11 DIAGNOSIS — C180 Malignant neoplasm of cecum: Secondary | ICD-10-CM | POA: Diagnosis not present

## 2017-10-11 DIAGNOSIS — M545 Low back pain: Secondary | ICD-10-CM | POA: Diagnosis not present

## 2017-10-15 DIAGNOSIS — C188 Malignant neoplasm of overlapping sites of colon: Secondary | ICD-10-CM | POA: Diagnosis not present

## 2017-10-19 DIAGNOSIS — C188 Malignant neoplasm of overlapping sites of colon: Secondary | ICD-10-CM | POA: Diagnosis not present

## 2017-10-19 DIAGNOSIS — Z79891 Long term (current) use of opiate analgesic: Secondary | ICD-10-CM | POA: Diagnosis not present

## 2017-10-19 DIAGNOSIS — Z7982 Long term (current) use of aspirin: Secondary | ICD-10-CM | POA: Diagnosis not present

## 2017-10-21 DIAGNOSIS — Z452 Encounter for adjustment and management of vascular access device: Secondary | ICD-10-CM | POA: Diagnosis not present

## 2017-10-21 DIAGNOSIS — C188 Malignant neoplasm of overlapping sites of colon: Secondary | ICD-10-CM | POA: Diagnosis not present

## 2017-10-24 DIAGNOSIS — C188 Malignant neoplasm of overlapping sites of colon: Secondary | ICD-10-CM | POA: Diagnosis not present

## 2017-10-24 DIAGNOSIS — Z9049 Acquired absence of other specified parts of digestive tract: Secondary | ICD-10-CM | POA: Diagnosis not present

## 2017-10-24 DIAGNOSIS — R1010 Upper abdominal pain, unspecified: Secondary | ICD-10-CM | POA: Diagnosis not present

## 2017-10-24 DIAGNOSIS — K429 Umbilical hernia without obstruction or gangrene: Secondary | ICD-10-CM | POA: Diagnosis not present

## 2017-10-24 DIAGNOSIS — R1032 Left lower quadrant pain: Secondary | ICD-10-CM | POA: Diagnosis not present

## 2017-11-08 DIAGNOSIS — M545 Low back pain: Secondary | ICD-10-CM | POA: Diagnosis not present

## 2017-11-08 DIAGNOSIS — I2510 Atherosclerotic heart disease of native coronary artery without angina pectoris: Secondary | ICD-10-CM | POA: Diagnosis not present

## 2017-11-08 DIAGNOSIS — C180 Malignant neoplasm of cecum: Secondary | ICD-10-CM | POA: Diagnosis not present

## 2017-11-08 DIAGNOSIS — M159 Polyosteoarthritis, unspecified: Secondary | ICD-10-CM | POA: Diagnosis not present

## 2017-11-09 DIAGNOSIS — C188 Malignant neoplasm of overlapping sites of colon: Secondary | ICD-10-CM | POA: Diagnosis not present

## 2017-11-09 DIAGNOSIS — E538 Deficiency of other specified B group vitamins: Secondary | ICD-10-CM | POA: Diagnosis not present

## 2017-11-09 DIAGNOSIS — R21 Rash and other nonspecific skin eruption: Secondary | ICD-10-CM | POA: Diagnosis not present

## 2017-11-11 DIAGNOSIS — C188 Malignant neoplasm of overlapping sites of colon: Secondary | ICD-10-CM | POA: Diagnosis not present

## 2017-11-11 DIAGNOSIS — Z452 Encounter for adjustment and management of vascular access device: Secondary | ICD-10-CM | POA: Diagnosis not present

## 2017-11-14 DIAGNOSIS — M25551 Pain in right hip: Secondary | ICD-10-CM | POA: Diagnosis not present

## 2017-11-14 DIAGNOSIS — R52 Pain, unspecified: Secondary | ICD-10-CM | POA: Diagnosis not present

## 2017-11-14 DIAGNOSIS — W19XXXA Unspecified fall, initial encounter: Secondary | ICD-10-CM | POA: Diagnosis not present

## 2017-11-14 DIAGNOSIS — S79911A Unspecified injury of right hip, initial encounter: Secondary | ICD-10-CM | POA: Diagnosis not present

## 2017-11-15 DIAGNOSIS — C188 Malignant neoplasm of overlapping sites of colon: Secondary | ICD-10-CM | POA: Diagnosis not present

## 2017-11-18 DIAGNOSIS — S3993XA Unspecified injury of pelvis, initial encounter: Secondary | ICD-10-CM | POA: Diagnosis not present

## 2017-11-18 DIAGNOSIS — C188 Malignant neoplasm of overlapping sites of colon: Secondary | ICD-10-CM | POA: Diagnosis not present

## 2017-11-18 DIAGNOSIS — R1030 Lower abdominal pain, unspecified: Secondary | ICD-10-CM | POA: Diagnosis not present

## 2017-11-23 DIAGNOSIS — E538 Deficiency of other specified B group vitamins: Secondary | ICD-10-CM | POA: Diagnosis not present

## 2017-11-23 DIAGNOSIS — K429 Umbilical hernia without obstruction or gangrene: Secondary | ICD-10-CM

## 2017-11-23 DIAGNOSIS — C188 Malignant neoplasm of overlapping sites of colon: Secondary | ICD-10-CM | POA: Diagnosis not present

## 2017-11-23 DIAGNOSIS — D701 Agranulocytosis secondary to cancer chemotherapy: Secondary | ICD-10-CM

## 2017-11-23 DIAGNOSIS — Z9181 History of falling: Secondary | ICD-10-CM

## 2017-11-23 DIAGNOSIS — Z9049 Acquired absence of other specified parts of digestive tract: Secondary | ICD-10-CM | POA: Diagnosis not present

## 2017-11-23 DIAGNOSIS — D649 Anemia, unspecified: Secondary | ICD-10-CM | POA: Diagnosis not present

## 2017-11-23 DIAGNOSIS — M25551 Pain in right hip: Secondary | ICD-10-CM | POA: Diagnosis not present

## 2017-11-23 DIAGNOSIS — C180 Malignant neoplasm of cecum: Secondary | ICD-10-CM | POA: Diagnosis not present

## 2017-11-23 DIAGNOSIS — Z7952 Long term (current) use of systemic steroids: Secondary | ICD-10-CM

## 2017-11-23 DIAGNOSIS — T451X5A Adverse effect of antineoplastic and immunosuppressive drugs, initial encounter: Secondary | ICD-10-CM | POA: Diagnosis not present

## 2017-11-25 DIAGNOSIS — Z452 Encounter for adjustment and management of vascular access device: Secondary | ICD-10-CM | POA: Diagnosis not present

## 2017-11-25 DIAGNOSIS — C188 Malignant neoplasm of overlapping sites of colon: Secondary | ICD-10-CM | POA: Diagnosis not present

## 2017-12-07 DIAGNOSIS — C188 Malignant neoplasm of overlapping sites of colon: Secondary | ICD-10-CM | POA: Diagnosis not present

## 2017-12-07 DIAGNOSIS — Z5111 Encounter for antineoplastic chemotherapy: Secondary | ICD-10-CM | POA: Diagnosis not present

## 2017-12-07 DIAGNOSIS — Z23 Encounter for immunization: Secondary | ICD-10-CM | POA: Diagnosis not present

## 2017-12-08 DIAGNOSIS — I10 Essential (primary) hypertension: Secondary | ICD-10-CM | POA: Diagnosis not present

## 2017-12-08 DIAGNOSIS — M545 Low back pain: Secondary | ICD-10-CM | POA: Diagnosis not present

## 2017-12-08 DIAGNOSIS — E7800 Pure hypercholesterolemia, unspecified: Secondary | ICD-10-CM | POA: Diagnosis not present

## 2017-12-08 DIAGNOSIS — D649 Anemia, unspecified: Secondary | ICD-10-CM | POA: Diagnosis not present

## 2017-12-08 DIAGNOSIS — M159 Polyosteoarthritis, unspecified: Secondary | ICD-10-CM | POA: Diagnosis not present

## 2017-12-23 DIAGNOSIS — M25551 Pain in right hip: Secondary | ICD-10-CM | POA: Diagnosis not present

## 2017-12-31 DIAGNOSIS — C180 Malignant neoplasm of cecum: Secondary | ICD-10-CM | POA: Diagnosis not present

## 2017-12-31 DIAGNOSIS — I2510 Atherosclerotic heart disease of native coronary artery without angina pectoris: Secondary | ICD-10-CM | POA: Diagnosis not present

## 2017-12-31 DIAGNOSIS — M545 Low back pain: Secondary | ICD-10-CM | POA: Diagnosis not present

## 2017-12-31 DIAGNOSIS — M159 Polyosteoarthritis, unspecified: Secondary | ICD-10-CM | POA: Diagnosis not present

## 2018-01-02 DIAGNOSIS — R1031 Right lower quadrant pain: Secondary | ICD-10-CM | POA: Diagnosis not present

## 2018-01-02 DIAGNOSIS — K4090 Unilateral inguinal hernia, without obstruction or gangrene, not specified as recurrent: Secondary | ICD-10-CM | POA: Diagnosis not present

## 2018-01-04 DIAGNOSIS — M545 Low back pain: Secondary | ICD-10-CM

## 2018-01-04 DIAGNOSIS — K429 Umbilical hernia without obstruction or gangrene: Secondary | ICD-10-CM

## 2018-01-04 DIAGNOSIS — K4090 Unilateral inguinal hernia, without obstruction or gangrene, not specified as recurrent: Secondary | ICD-10-CM

## 2018-01-04 DIAGNOSIS — K439 Ventral hernia without obstruction or gangrene: Secondary | ICD-10-CM | POA: Diagnosis not present

## 2018-01-04 DIAGNOSIS — D649 Anemia, unspecified: Secondary | ICD-10-CM

## 2018-01-04 DIAGNOSIS — M25551 Pain in right hip: Secondary | ICD-10-CM | POA: Diagnosis not present

## 2018-01-04 DIAGNOSIS — C180 Malignant neoplasm of cecum: Secondary | ICD-10-CM | POA: Diagnosis not present

## 2018-01-04 DIAGNOSIS — E538 Deficiency of other specified B group vitamins: Secondary | ICD-10-CM | POA: Diagnosis not present

## 2018-01-04 DIAGNOSIS — Z9049 Acquired absence of other specified parts of digestive tract: Secondary | ICD-10-CM | POA: Diagnosis not present

## 2018-01-04 DIAGNOSIS — C188 Malignant neoplasm of overlapping sites of colon: Secondary | ICD-10-CM | POA: Diagnosis not present

## 2018-01-05 DIAGNOSIS — K432 Incisional hernia without obstruction or gangrene: Secondary | ICD-10-CM | POA: Diagnosis not present

## 2018-01-05 DIAGNOSIS — I2510 Atherosclerotic heart disease of native coronary artery without angina pectoris: Secondary | ICD-10-CM | POA: Diagnosis not present

## 2018-01-05 DIAGNOSIS — M159 Polyosteoarthritis, unspecified: Secondary | ICD-10-CM | POA: Diagnosis not present

## 2018-01-05 DIAGNOSIS — E7800 Pure hypercholesterolemia, unspecified: Secondary | ICD-10-CM | POA: Diagnosis not present

## 2018-01-05 DIAGNOSIS — C180 Malignant neoplasm of cecum: Secondary | ICD-10-CM | POA: Diagnosis not present

## 2018-01-16 DIAGNOSIS — Z955 Presence of coronary angioplasty implant and graft: Secondary | ICD-10-CM | POA: Diagnosis not present

## 2018-01-16 DIAGNOSIS — K432 Incisional hernia without obstruction or gangrene: Secondary | ICD-10-CM | POA: Diagnosis not present

## 2018-01-16 DIAGNOSIS — G4730 Sleep apnea, unspecified: Secondary | ICD-10-CM | POA: Diagnosis not present

## 2018-01-16 DIAGNOSIS — G8918 Other acute postprocedural pain: Secondary | ICD-10-CM | POA: Diagnosis not present

## 2018-01-16 DIAGNOSIS — I252 Old myocardial infarction: Secondary | ICD-10-CM | POA: Diagnosis not present

## 2018-01-16 DIAGNOSIS — J449 Chronic obstructive pulmonary disease, unspecified: Secondary | ICD-10-CM | POA: Diagnosis not present

## 2018-01-16 DIAGNOSIS — Z85038 Personal history of other malignant neoplasm of large intestine: Secondary | ICD-10-CM | POA: Diagnosis not present

## 2018-01-16 DIAGNOSIS — Z79891 Long term (current) use of opiate analgesic: Secondary | ICD-10-CM | POA: Diagnosis not present

## 2018-01-16 DIAGNOSIS — E7800 Pure hypercholesterolemia, unspecified: Secondary | ICD-10-CM | POA: Diagnosis not present

## 2018-01-16 DIAGNOSIS — Z7982 Long term (current) use of aspirin: Secondary | ICD-10-CM | POA: Diagnosis not present

## 2018-01-16 DIAGNOSIS — M1990 Unspecified osteoarthritis, unspecified site: Secondary | ICD-10-CM | POA: Diagnosis not present

## 2018-01-16 DIAGNOSIS — Z79899 Other long term (current) drug therapy: Secondary | ICD-10-CM | POA: Diagnosis not present

## 2018-01-16 DIAGNOSIS — I2510 Atherosclerotic heart disease of native coronary artery without angina pectoris: Secondary | ICD-10-CM | POA: Diagnosis not present

## 2018-01-16 DIAGNOSIS — I10 Essential (primary) hypertension: Secondary | ICD-10-CM | POA: Diagnosis not present

## 2018-01-26 DIAGNOSIS — M159 Polyosteoarthritis, unspecified: Secondary | ICD-10-CM | POA: Diagnosis not present

## 2018-01-26 DIAGNOSIS — E7800 Pure hypercholesterolemia, unspecified: Secondary | ICD-10-CM | POA: Diagnosis not present

## 2018-01-26 DIAGNOSIS — C180 Malignant neoplasm of cecum: Secondary | ICD-10-CM | POA: Diagnosis not present

## 2018-01-26 DIAGNOSIS — I2510 Atherosclerotic heart disease of native coronary artery without angina pectoris: Secondary | ICD-10-CM | POA: Diagnosis not present

## 2018-02-01 DIAGNOSIS — C188 Malignant neoplasm of overlapping sites of colon: Secondary | ICD-10-CM | POA: Diagnosis not present

## 2018-02-08 DIAGNOSIS — C180 Malignant neoplasm of cecum: Secondary | ICD-10-CM | POA: Diagnosis not present

## 2018-03-02 DIAGNOSIS — E7800 Pure hypercholesterolemia, unspecified: Secondary | ICD-10-CM | POA: Diagnosis not present

## 2018-03-02 DIAGNOSIS — Z139 Encounter for screening, unspecified: Secondary | ICD-10-CM | POA: Diagnosis not present

## 2018-03-02 DIAGNOSIS — M159 Polyosteoarthritis, unspecified: Secondary | ICD-10-CM | POA: Diagnosis not present

## 2018-03-02 DIAGNOSIS — I2510 Atherosclerotic heart disease of native coronary artery without angina pectoris: Secondary | ICD-10-CM | POA: Diagnosis not present

## 2018-03-15 DIAGNOSIS — C188 Malignant neoplasm of overlapping sites of colon: Secondary | ICD-10-CM | POA: Diagnosis not present

## 2018-03-28 DIAGNOSIS — I2510 Atherosclerotic heart disease of native coronary artery without angina pectoris: Secondary | ICD-10-CM | POA: Diagnosis not present

## 2018-03-28 DIAGNOSIS — Z9181 History of falling: Secondary | ICD-10-CM | POA: Diagnosis not present

## 2018-03-28 DIAGNOSIS — Z139 Encounter for screening, unspecified: Secondary | ICD-10-CM | POA: Diagnosis not present

## 2018-03-28 DIAGNOSIS — E7800 Pure hypercholesterolemia, unspecified: Secondary | ICD-10-CM | POA: Diagnosis not present

## 2018-03-28 DIAGNOSIS — M159 Polyosteoarthritis, unspecified: Secondary | ICD-10-CM | POA: Diagnosis not present

## 2018-03-28 DIAGNOSIS — L0390 Cellulitis, unspecified: Secondary | ICD-10-CM | POA: Diagnosis not present

## 2018-04-10 DIAGNOSIS — E7800 Pure hypercholesterolemia, unspecified: Secondary | ICD-10-CM | POA: Diagnosis not present

## 2018-04-10 DIAGNOSIS — I10 Essential (primary) hypertension: Secondary | ICD-10-CM | POA: Diagnosis not present

## 2018-04-10 DIAGNOSIS — I2510 Atherosclerotic heart disease of native coronary artery without angina pectoris: Secondary | ICD-10-CM | POA: Diagnosis not present

## 2018-04-10 DIAGNOSIS — M159 Polyosteoarthritis, unspecified: Secondary | ICD-10-CM | POA: Diagnosis not present

## 2018-04-12 DIAGNOSIS — K4090 Unilateral inguinal hernia, without obstruction or gangrene, not specified as recurrent: Secondary | ICD-10-CM | POA: Diagnosis not present

## 2018-04-12 DIAGNOSIS — M545 Low back pain: Secondary | ICD-10-CM | POA: Diagnosis not present

## 2018-04-12 DIAGNOSIS — Z85038 Personal history of other malignant neoplasm of large intestine: Secondary | ICD-10-CM | POA: Diagnosis not present

## 2018-04-12 DIAGNOSIS — D649 Anemia, unspecified: Secondary | ICD-10-CM | POA: Diagnosis not present

## 2018-04-12 DIAGNOSIS — M25551 Pain in right hip: Secondary | ICD-10-CM | POA: Diagnosis not present

## 2018-04-12 DIAGNOSIS — E538 Deficiency of other specified B group vitamins: Secondary | ICD-10-CM | POA: Diagnosis not present

## 2018-05-10 DIAGNOSIS — E538 Deficiency of other specified B group vitamins: Secondary | ICD-10-CM | POA: Diagnosis not present

## 2018-05-10 DIAGNOSIS — C188 Malignant neoplasm of overlapping sites of colon: Secondary | ICD-10-CM | POA: Diagnosis not present

## 2018-05-26 DIAGNOSIS — M159 Polyosteoarthritis, unspecified: Secondary | ICD-10-CM | POA: Diagnosis not present

## 2018-05-26 DIAGNOSIS — Z9181 History of falling: Secondary | ICD-10-CM | POA: Diagnosis not present

## 2018-05-26 DIAGNOSIS — E785 Hyperlipidemia, unspecified: Secondary | ICD-10-CM | POA: Diagnosis not present

## 2018-05-26 DIAGNOSIS — Z0000 Encounter for general adult medical examination without abnormal findings: Secondary | ICD-10-CM | POA: Diagnosis not present

## 2018-05-26 DIAGNOSIS — Z1211 Encounter for screening for malignant neoplasm of colon: Secondary | ICD-10-CM | POA: Diagnosis not present

## 2018-05-26 DIAGNOSIS — I2510 Atherosclerotic heart disease of native coronary artery without angina pectoris: Secondary | ICD-10-CM | POA: Diagnosis not present

## 2018-05-26 DIAGNOSIS — Z23 Encounter for immunization: Secondary | ICD-10-CM | POA: Diagnosis not present

## 2018-06-06 DIAGNOSIS — R1012 Left upper quadrant pain: Secondary | ICD-10-CM | POA: Diagnosis not present

## 2018-06-08 DIAGNOSIS — R1032 Left lower quadrant pain: Secondary | ICD-10-CM | POA: Diagnosis not present

## 2018-06-28 DIAGNOSIS — E538 Deficiency of other specified B group vitamins: Secondary | ICD-10-CM | POA: Diagnosis not present

## 2018-07-05 DIAGNOSIS — C188 Malignant neoplasm of overlapping sites of colon: Secondary | ICD-10-CM | POA: Diagnosis not present

## 2018-07-05 DIAGNOSIS — D649 Anemia, unspecified: Secondary | ICD-10-CM | POA: Diagnosis not present

## 2018-07-05 DIAGNOSIS — K4090 Unilateral inguinal hernia, without obstruction or gangrene, not specified as recurrent: Secondary | ICD-10-CM | POA: Diagnosis not present

## 2018-07-05 DIAGNOSIS — E538 Deficiency of other specified B group vitamins: Secondary | ICD-10-CM | POA: Diagnosis not present

## 2018-07-05 DIAGNOSIS — M545 Low back pain: Secondary | ICD-10-CM | POA: Diagnosis not present

## 2018-07-05 DIAGNOSIS — C180 Malignant neoplasm of cecum: Secondary | ICD-10-CM | POA: Diagnosis not present

## 2018-07-05 DIAGNOSIS — Z85038 Personal history of other malignant neoplasm of large intestine: Secondary | ICD-10-CM | POA: Diagnosis not present

## 2018-07-05 DIAGNOSIS — M25551 Pain in right hip: Secondary | ICD-10-CM | POA: Diagnosis not present

## 2018-07-10 DIAGNOSIS — C188 Malignant neoplasm of overlapping sites of colon: Secondary | ICD-10-CM | POA: Diagnosis not present

## 2018-07-10 DIAGNOSIS — R1907 Generalized intra-abdominal and pelvic swelling, mass and lump: Secondary | ICD-10-CM | POA: Diagnosis not present

## 2018-07-10 DIAGNOSIS — Z85038 Personal history of other malignant neoplasm of large intestine: Secondary | ICD-10-CM | POA: Diagnosis not present

## 2018-07-11 DIAGNOSIS — I10 Essential (primary) hypertension: Secondary | ICD-10-CM | POA: Diagnosis not present

## 2018-07-11 DIAGNOSIS — M159 Polyosteoarthritis, unspecified: Secondary | ICD-10-CM | POA: Diagnosis not present

## 2018-07-11 DIAGNOSIS — K589 Irritable bowel syndrome without diarrhea: Secondary | ICD-10-CM | POA: Diagnosis not present

## 2018-07-11 DIAGNOSIS — I2510 Atherosclerotic heart disease of native coronary artery without angina pectoris: Secondary | ICD-10-CM | POA: Diagnosis not present

## 2018-08-08 DIAGNOSIS — I2510 Atherosclerotic heart disease of native coronary artery without angina pectoris: Secondary | ICD-10-CM | POA: Diagnosis not present

## 2018-08-08 DIAGNOSIS — C180 Malignant neoplasm of cecum: Secondary | ICD-10-CM | POA: Diagnosis not present

## 2018-08-08 DIAGNOSIS — M545 Low back pain: Secondary | ICD-10-CM | POA: Diagnosis not present

## 2018-08-15 DIAGNOSIS — Z8500 Personal history of malignant neoplasm of unspecified digestive organ: Secondary | ICD-10-CM | POA: Diagnosis not present

## 2018-08-17 DIAGNOSIS — N3281 Overactive bladder: Secondary | ICD-10-CM | POA: Diagnosis not present

## 2018-08-22 DIAGNOSIS — E7800 Pure hypercholesterolemia, unspecified: Secondary | ICD-10-CM | POA: Diagnosis not present

## 2018-08-22 DIAGNOSIS — I2510 Atherosclerotic heart disease of native coronary artery without angina pectoris: Secondary | ICD-10-CM | POA: Diagnosis not present

## 2018-08-22 DIAGNOSIS — Z87891 Personal history of nicotine dependence: Secondary | ICD-10-CM | POA: Diagnosis not present

## 2018-08-23 DIAGNOSIS — E538 Deficiency of other specified B group vitamins: Secondary | ICD-10-CM | POA: Diagnosis not present

## 2018-08-23 DIAGNOSIS — C188 Malignant neoplasm of overlapping sites of colon: Secondary | ICD-10-CM | POA: Diagnosis not present

## 2018-09-14 ENCOUNTER — Other Ambulatory Visit: Payer: Self-pay

## 2018-09-14 NOTE — Patient Outreach (Signed)
[facility]) Care Management  09/14/2018  Jesse Crosby Apr 30, 1950 [id number]    Received referral on 08-30-2018 from [place] to follow up with member for complex CM due to HTN.  Called member at preferred number and member answered phone. Introduced self and explained reason for the call. HIPPA identifiers verified.   Explained that [facility] care coordination services, connection to local community resources and personal assistance in managing member's healthcare needs are provided to member without cost. Additional benefits explained as well. Member verbalized agreement with services; however, requested to be called at a later date due to "I am hurting and you woke me up".   Will send successful outreach letter and welcome letter to member. Will send physicians barrier letter to PCP. Will follow up with member within 3 weeks and member states preference to be called in the morning time.  [name] [name], RN-BSN  [facility] Care Management  Community Care Management Coordinator  [phone] [email or web address]

## 2018-09-29 DIAGNOSIS — R1031 Right lower quadrant pain: Secondary | ICD-10-CM | POA: Diagnosis not present

## 2018-09-29 DIAGNOSIS — R1110 Vomiting, unspecified: Secondary | ICD-10-CM | POA: Diagnosis not present

## 2018-09-29 DIAGNOSIS — N3281 Overactive bladder: Secondary | ICD-10-CM | POA: Diagnosis not present

## 2018-09-29 DIAGNOSIS — R1030 Lower abdominal pain, unspecified: Secondary | ICD-10-CM | POA: Diagnosis not present

## 2018-09-29 DIAGNOSIS — R1032 Left lower quadrant pain: Secondary | ICD-10-CM | POA: Diagnosis not present

## 2018-10-02 DIAGNOSIS — R109 Unspecified abdominal pain: Secondary | ICD-10-CM | POA: Diagnosis not present

## 2018-10-02 DIAGNOSIS — I2510 Atherosclerotic heart disease of native coronary artery without angina pectoris: Secondary | ICD-10-CM | POA: Diagnosis not present

## 2018-10-02 DIAGNOSIS — Z87891 Personal history of nicotine dependence: Secondary | ICD-10-CM | POA: Diagnosis not present

## 2018-10-03 ENCOUNTER — Other Ambulatory Visit: Payer: Self-pay

## 2018-10-03 DIAGNOSIS — K668 Other specified disorders of peritoneum: Secondary | ICD-10-CM | POA: Diagnosis not present

## 2018-10-03 NOTE — Patient Outreach (Signed)
[place] [facility]) Care Management  10/03/2018  Jesse Crosby Mar 03, 1951 [id number]  Unsuccessful Outreach x 1  Called member at preferred number and member stated "I am at the [place] office and it is possible that I may be admitted for surgery, but I don't know yet." Member requested a return call on "Friday afternoon".  Will return call on Friday as requested via member.  [name] [name], RN-BSN  [facility] Care Management  Community Care Management Coordinator  [phone] [email or web address]

## 2018-10-04 DIAGNOSIS — C188 Malignant neoplasm of overlapping sites of colon: Secondary | ICD-10-CM | POA: Diagnosis not present

## 2018-10-04 DIAGNOSIS — I82291 Chronic embolism and thrombosis of other thoracic veins: Secondary | ICD-10-CM | POA: Diagnosis not present

## 2018-10-04 DIAGNOSIS — R978 Other abnormal tumor markers: Secondary | ICD-10-CM | POA: Diagnosis not present

## 2018-10-05 DIAGNOSIS — Z79899 Other long term (current) drug therapy: Secondary | ICD-10-CM | POA: Diagnosis not present

## 2018-10-05 DIAGNOSIS — C188 Malignant neoplasm of overlapping sites of colon: Secondary | ICD-10-CM | POA: Diagnosis not present

## 2018-10-05 DIAGNOSIS — Z7982 Long term (current) use of aspirin: Secondary | ICD-10-CM | POA: Diagnosis not present

## 2018-10-05 DIAGNOSIS — I82291 Chronic embolism and thrombosis of other thoracic veins: Secondary | ICD-10-CM | POA: Diagnosis not present

## 2018-10-06 ENCOUNTER — Other Ambulatory Visit: Payer: Self-pay

## 2018-10-06 ENCOUNTER — Ambulatory Visit: Payer: Medicare Other

## 2018-10-06 NOTE — Patient Outreach (Signed)
[facility]) Care Management  10/06/2018  Jesse Crosby 04-06-1950 [id number]  Unsuccessful Outreach x 2  Called member at preferred number and no answer. HIPPA compliant voice mail message unable to be left due tovoicemail message not set up yet.   Will send unsuccessful outreach letter. Will call member within 3-4 business days.  [name] [name], RN-BSN  [facility] Care Management  Community Care Management Coordinator  [phone] [email or web address]

## 2018-10-10 DIAGNOSIS — C188 Malignant neoplasm of overlapping sites of colon: Secondary | ICD-10-CM | POA: Diagnosis not present

## 2018-10-10 DIAGNOSIS — C772 Secondary and unspecified malignant neoplasm of intra-abdominal lymph nodes: Secondary | ICD-10-CM | POA: Diagnosis not present

## 2018-10-10 DIAGNOSIS — K668 Other specified disorders of peritoneum: Secondary | ICD-10-CM | POA: Diagnosis not present

## 2018-10-10 DIAGNOSIS — E7800 Pure hypercholesterolemia, unspecified: Secondary | ICD-10-CM | POA: Diagnosis not present

## 2018-10-10 DIAGNOSIS — C786 Secondary malignant neoplasm of retroperitoneum and peritoneum: Secondary | ICD-10-CM | POA: Diagnosis not present

## 2018-10-10 DIAGNOSIS — C801 Malignant (primary) neoplasm, unspecified: Secondary | ICD-10-CM | POA: Diagnosis not present

## 2018-10-10 DIAGNOSIS — I10 Essential (primary) hypertension: Secondary | ICD-10-CM | POA: Diagnosis not present

## 2018-10-10 DIAGNOSIS — I252 Old myocardial infarction: Secondary | ICD-10-CM | POA: Diagnosis not present

## 2018-10-10 DIAGNOSIS — C481 Malignant neoplasm of specified parts of peritoneum: Secondary | ICD-10-CM | POA: Diagnosis not present

## 2018-10-10 DIAGNOSIS — I2510 Atherosclerotic heart disease of native coronary artery without angina pectoris: Secondary | ICD-10-CM | POA: Diagnosis not present

## 2018-10-10 DIAGNOSIS — C189 Malignant neoplasm of colon, unspecified: Secondary | ICD-10-CM | POA: Diagnosis not present

## 2018-10-10 DIAGNOSIS — Z7982 Long term (current) use of aspirin: Secondary | ICD-10-CM | POA: Diagnosis not present

## 2018-10-10 DIAGNOSIS — Z79899 Other long term (current) drug therapy: Secondary | ICD-10-CM | POA: Diagnosis not present

## 2018-10-10 DIAGNOSIS — K219 Gastro-esophageal reflux disease without esophagitis: Secondary | ICD-10-CM | POA: Diagnosis not present

## 2018-10-11 DIAGNOSIS — Z87891 Personal history of nicotine dependence: Secondary | ICD-10-CM | POA: Diagnosis not present

## 2018-10-11 DIAGNOSIS — I10 Essential (primary) hypertension: Secondary | ICD-10-CM | POA: Diagnosis not present

## 2018-10-11 DIAGNOSIS — I2510 Atherosclerotic heart disease of native coronary artery without angina pectoris: Secondary | ICD-10-CM | POA: Diagnosis not present

## 2018-10-11 DIAGNOSIS — R109 Unspecified abdominal pain: Secondary | ICD-10-CM | POA: Diagnosis not present

## 2018-10-12 ENCOUNTER — Other Ambulatory Visit: Payer: Self-pay

## 2018-10-12 NOTE — Patient Outreach (Signed)
[facility]) Care Management  10/12/2018  Jesse Crosby [birth year] [id number]  Unsuccessful outreach x 3.  Called member at preferred number and member answered phone. Introduced self and explained reason for call. Member identified his name and stated "I am in bed sick.    Will call member within 3-4 business days.  Jesse Mola "ANN" [name], RN-BSN  [facility] Care Management  Community Care Management Coordinator  [phone] [email or web address]

## 2018-10-17 ENCOUNTER — Other Ambulatory Visit: Payer: Self-pay

## 2018-10-17 NOTE — Patient Outreach (Addendum)
[facility] [facility]) Care Management  10/17/2018  Ephriam Turman 04-19-1950 [id number]   Unsuccessful Outreach attempt x 5  Called member at preferred number and member answered phone. Introduced self and explained reason for the call. HIPPA identifiers verified.   Member states he has "just took a pain pill" and is unable to assist with initial assessment. RN CM with multiple unsuccessful outreach attempts with member; therefore, RN CM asked member for clarity as to whether member prefers to continue with care received from [facility] and member responded "yes, I would". Member stated he has a lot going on in his life right now with his cancer treatments and with his pain and requests to be called at later time. Member states he is going to relax as he has just taken his pain medication.  Plan: Will refer member to [place] to follow up with member at later date as RN CM will soon transition and member will be able to have care needs assessed via Health Coach. Will send discipline closure letter to PCP. No other [facility] Care Team member involved in care at this time. Will close case.  [name] [name], RN-BSN  [facility] Care Management  Community Care Management Coordinator  [phone] [email or web address]

## 2018-10-19 DIAGNOSIS — Z79899 Other long term (current) drug therapy: Secondary | ICD-10-CM | POA: Diagnosis not present

## 2018-10-19 DIAGNOSIS — E785 Hyperlipidemia, unspecified: Secondary | ICD-10-CM | POA: Diagnosis not present

## 2018-10-19 DIAGNOSIS — Z9989 Dependence on other enabling machines and devices: Secondary | ICD-10-CM | POA: Diagnosis not present

## 2018-10-19 DIAGNOSIS — K668 Other specified disorders of peritoneum: Secondary | ICD-10-CM | POA: Diagnosis not present

## 2018-10-19 DIAGNOSIS — C189 Malignant neoplasm of colon, unspecified: Secondary | ICD-10-CM | POA: Diagnosis not present

## 2018-10-19 DIAGNOSIS — I10 Essential (primary) hypertension: Secondary | ICD-10-CM | POA: Diagnosis not present

## 2018-10-19 DIAGNOSIS — J449 Chronic obstructive pulmonary disease, unspecified: Secondary | ICD-10-CM | POA: Diagnosis not present

## 2018-10-19 DIAGNOSIS — C786 Secondary malignant neoplasm of retroperitoneum and peritoneum: Secondary | ICD-10-CM | POA: Diagnosis not present

## 2018-10-19 DIAGNOSIS — Z955 Presence of coronary angioplasty implant and graft: Secondary | ICD-10-CM | POA: Diagnosis not present

## 2018-10-19 DIAGNOSIS — I252 Old myocardial infarction: Secondary | ICD-10-CM | POA: Diagnosis not present

## 2018-10-19 DIAGNOSIS — J45909 Unspecified asthma, uncomplicated: Secondary | ICD-10-CM | POA: Diagnosis not present

## 2018-10-19 DIAGNOSIS — Z452 Encounter for adjustment and management of vascular access device: Secondary | ICD-10-CM | POA: Diagnosis not present

## 2018-10-19 DIAGNOSIS — Z7982 Long term (current) use of aspirin: Secondary | ICD-10-CM | POA: Diagnosis not present

## 2018-10-19 DIAGNOSIS — K219 Gastro-esophageal reflux disease without esophagitis: Secondary | ICD-10-CM | POA: Diagnosis not present

## 2018-10-20 ENCOUNTER — Other Ambulatory Visit: Payer: Self-pay | Admitting: *Deleted

## 2018-10-24 DIAGNOSIS — R970 Elevated carcinoembryonic antigen [CEA]: Secondary | ICD-10-CM | POA: Diagnosis not present

## 2018-10-24 DIAGNOSIS — C189 Malignant neoplasm of colon, unspecified: Secondary | ICD-10-CM | POA: Diagnosis not present

## 2018-10-24 DIAGNOSIS — C188 Malignant neoplasm of overlapping sites of colon: Secondary | ICD-10-CM | POA: Diagnosis not present

## 2018-10-24 DIAGNOSIS — K5900 Constipation, unspecified: Secondary | ICD-10-CM | POA: Diagnosis not present

## 2018-10-24 DIAGNOSIS — C786 Secondary malignant neoplasm of retroperitoneum and peritoneum: Secondary | ICD-10-CM | POA: Diagnosis not present

## 2018-10-25 DIAGNOSIS — I82291 Chronic embolism and thrombosis of other thoracic veins: Secondary | ICD-10-CM | POA: Diagnosis not present

## 2018-10-25 DIAGNOSIS — C786 Secondary malignant neoplasm of retroperitoneum and peritoneum: Secondary | ICD-10-CM | POA: Diagnosis not present

## 2018-10-25 DIAGNOSIS — Z5111 Encounter for antineoplastic chemotherapy: Secondary | ICD-10-CM | POA: Diagnosis not present

## 2018-10-25 DIAGNOSIS — R300 Dysuria: Secondary | ICD-10-CM | POA: Diagnosis not present

## 2018-10-25 DIAGNOSIS — K5900 Constipation, unspecified: Secondary | ICD-10-CM | POA: Diagnosis not present

## 2018-10-25 DIAGNOSIS — C188 Malignant neoplasm of overlapping sites of colon: Secondary | ICD-10-CM | POA: Diagnosis not present

## 2018-10-25 DIAGNOSIS — R970 Elevated carcinoembryonic antigen [CEA]: Secondary | ICD-10-CM | POA: Diagnosis not present

## 2018-10-27 DIAGNOSIS — C188 Malignant neoplasm of overlapping sites of colon: Secondary | ICD-10-CM | POA: Diagnosis not present

## 2018-10-27 DIAGNOSIS — C786 Secondary malignant neoplasm of retroperitoneum and peritoneum: Secondary | ICD-10-CM | POA: Diagnosis not present

## 2018-10-27 DIAGNOSIS — I82291 Chronic embolism and thrombosis of other thoracic veins: Secondary | ICD-10-CM | POA: Diagnosis not present

## 2018-11-01 DIAGNOSIS — C786 Secondary malignant neoplasm of retroperitoneum and peritoneum: Secondary | ICD-10-CM | POA: Diagnosis not present

## 2018-11-01 DIAGNOSIS — C188 Malignant neoplasm of overlapping sites of colon: Secondary | ICD-10-CM | POA: Diagnosis not present

## 2018-11-03 ENCOUNTER — Ambulatory Visit: Payer: Self-pay | Admitting: *Deleted

## 2018-11-06 DIAGNOSIS — C786 Secondary malignant neoplasm of retroperitoneum and peritoneum: Secondary | ICD-10-CM | POA: Diagnosis not present

## 2018-11-06 DIAGNOSIS — I82291 Chronic embolism and thrombosis of other thoracic veins: Secondary | ICD-10-CM | POA: Diagnosis not present

## 2018-11-06 DIAGNOSIS — C188 Malignant neoplasm of overlapping sites of colon: Secondary | ICD-10-CM | POA: Diagnosis not present

## 2018-11-08 ENCOUNTER — Other Ambulatory Visit: Payer: Self-pay | Admitting: *Deleted

## 2018-11-08 NOTE — Patient Outreach (Signed)
[place] [facility]) Care Management  11/08/2018  Kooper Reichelderfer 12-11-1950 [id number]   RN Health Coach Initial Assessment  Referral Date:  10/17/2018 Referral Source:  Transfer from [place] Reason for Referral:  Continued Disease Management Education Insurance:  [employer]   Outreach Attempt:  Outreach attempt #1 to patient for introduction and initial telephone assessment.  Patient answered and stated he was sleeping, requesting call back another day.   Plan:  RN Health Coach will make another outreach attempt within the month of September.   [place] [phone] [email or web address]

## 2018-12-01 ENCOUNTER — Other Ambulatory Visit: Payer: Self-pay | Admitting: *Deleted

## 2018-12-01 NOTE — Patient Outreach (Signed)
[place] [facility]) Care Management  12/01/2018  Jesse Crosby 12/14/1950 [id number]   RN Health Coach Initial Assessment  Referral Date:  10/17/2018 Referral Source:  Transfer from [place] Reason for Referral:  Continued Disease Management Education Insurance:  [employer]   Outreach Attempt:  Outreach attempt #2 to patient for introduction and initial telephone assessment. No answer and unable to leave voicemail message due to voicemail box not being set up.  Plan:  RN Health Coach will make another outreach attempt within the month of October.  RN Health Coach will send Unsuccessful Outreach Letter to patient.  [place] [phone] [email or web address]

## 2018-12-07 DIAGNOSIS — I82291 Chronic embolism and thrombosis of other thoracic veins: Secondary | ICD-10-CM | POA: Diagnosis not present

## 2018-12-07 DIAGNOSIS — C786 Secondary malignant neoplasm of retroperitoneum and peritoneum: Secondary | ICD-10-CM | POA: Diagnosis not present

## 2018-12-07 DIAGNOSIS — C188 Malignant neoplasm of overlapping sites of colon: Secondary | ICD-10-CM | POA: Diagnosis not present

## 2018-12-28 ENCOUNTER — Other Ambulatory Visit: Payer: Self-pay | Admitting: *Deleted

## 2018-12-28 NOTE — Patient Outreach (Signed)
[place] [facility]) Care Management  12/28/2018  Tsering Mardirossian 18-Jun-1950 [id number]   RN Health Coach Case Closure  Referral Date:10/17/2018 Referral Source:Transfer from [facility] Reason for Referral:Continued Disease Management Education Insurance:[employer]   Outreach Attempt:  Unsuccessful outreach attempt to patient for initial assessment.  No answer and unable to leave voice message.  RN Health Coach outreach to [place] [phone]), which is listed as patient's payor source.  Spoke with Justice Rocher who stated patient was under [place] since 11/06/2018 and expired on 12/26/2018.  Plan:  RN Health Coach will close case as patient is expired.  [place] [phone] [email or web address]

## 2019-01-07 DEATH — deceased
